# Patient Record
Sex: Female | Born: 1995 | Hispanic: Yes | Marital: Single | Smoking: Never smoker
Health system: Southern US, Community
[De-identification: ages and names within clinical notes are randomized; demographics above are authoritative.]

## PROBLEM LIST (undated history)

## (undated) ENCOUNTER — Inpatient Hospital Stay (HOSPITAL_COMMUNITY): Payer: Self-pay

## (undated) DIAGNOSIS — H209 Unspecified iridocyclitis: Secondary | ICD-10-CM

## (undated) DIAGNOSIS — K819 Cholecystitis, unspecified: Secondary | ICD-10-CM

## (undated) DIAGNOSIS — Z789 Other specified health status: Secondary | ICD-10-CM

## (undated) DIAGNOSIS — E079 Disorder of thyroid, unspecified: Secondary | ICD-10-CM

## (undated) DIAGNOSIS — H4040X0 Glaucoma secondary to eye inflammation, unspecified eye, stage unspecified: Secondary | ICD-10-CM

## (undated) HISTORY — PX: MOUTH SURGERY: SHX715

---
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
## 2016-06-09 ENCOUNTER — Encounter (HOSPITAL_COMMUNITY): Payer: Self-pay

## 2016-06-09 ENCOUNTER — Emergency Department (HOSPITAL_COMMUNITY)
Admission: EM | Admit: 2016-06-09 | Discharge: 2016-06-09 | Disposition: A | Discharge status: Home / Self Care | Payer: Medicaid Other | Attending: Emergency Medicine | Admitting: Emergency Medicine

## 2016-06-09 DIAGNOSIS — R5383 Other fatigue: Secondary | ICD-10-CM

## 2016-06-09 DIAGNOSIS — M545 Low back pain, unspecified: Secondary | ICD-10-CM

## 2016-06-09 DIAGNOSIS — N9489 Other specified conditions associated with female genital organs and menstrual cycle: Secondary | ICD-10-CM | POA: Insufficient documentation

## 2016-06-09 DIAGNOSIS — R109 Unspecified abdominal pain: Secondary | ICD-10-CM | POA: Diagnosis present

## 2016-06-09 DIAGNOSIS — R112 Nausea with vomiting, unspecified: Secondary | ICD-10-CM | POA: Diagnosis not present

## 2016-06-09 DIAGNOSIS — R110 Nausea: Secondary | ICD-10-CM

## 2016-06-09 LAB — COMPREHENSIVE METABOLIC PANEL
ALBUMIN: 5.1 g/dL — AB (ref 3.5–5.0)
ALK PHOS: 57 U/L (ref 38–126)
ALT: 16 U/L (ref 14–54)
ANION GAP: 7 (ref 5–15)
AST: 24 U/L (ref 15–41)
BUN: 6 mg/dL (ref 6–20)
CALCIUM: 10.1 mg/dL (ref 8.9–10.3)
CO2: 26 mmol/L (ref 22–32)
Chloride: 107 mmol/L (ref 101–111)
Creatinine, Ser: 0.59 mg/dL (ref 0.44–1.00)
GFR calc Af Amer: >60 mL/min (ref >60)
GFR calc non Af Amer: >60 mL/min (ref >60)
GLUCOSE: 85 mg/dL (ref 65–99)
Potassium: 3.9 mmol/L (ref 3.5–5.1)
SODIUM: 140 mmol/L (ref 135–145)
Total Bilirubin: 0.5 mg/dL (ref 0.3–1.2)
Total Protein: 8.8 g/dL — ABNORMAL HIGH (ref 6.5–8.1)

## 2016-06-09 LAB — CBC WITH DIFFERENTIAL/PLATELET
BASOS ABS: 0.1 10*3/uL (ref 0.0–0.1)
BASOS PCT: 1 %
EOS ABS: 0.1 10*3/uL (ref 0.0–0.7)
Eosinophils Relative: 2 %
HCT: 44.2 % (ref 36.0–46.0)
HEMOGLOBIN: 15.1 g/dL — AB (ref 12.0–15.0)
Lymphocytes Relative: 37 %
Lymphs Abs: 2.2 10*3/uL (ref 0.7–4.0)
MCH: 30.4 pg (ref 26.0–34.0)
MCHC: 34.2 g/dL (ref 30.0–36.0)
MCV: 88.9 fL (ref 78.0–100.0)
MONOS PCT: 5 %
Monocytes Absolute: 0.3 10*3/uL (ref 0.1–1.0)
NEUTROS ABS: 3.3 10*3/uL (ref 1.7–7.7)
NEUTROS PCT: 55 %
Platelets: 265 10*3/uL (ref 150–400)
RBC: 4.97 MIL/uL (ref 3.87–5.11)
RDW: 13 % (ref 11.5–15.5)
WBC: 6 10*3/uL (ref 4.0–10.5)

## 2016-06-09 LAB — URINE MICROSCOPIC-ADD ON: RBC / HPF: NONE SEEN RBC/hpf (ref 0–5)

## 2016-06-09 LAB — URINALYSIS, ROUTINE W REFLEX MICROSCOPIC
BILIRUBIN URINE: NEGATIVE
Glucose, UA: NEGATIVE mg/dL
Ketones, ur: NEGATIVE mg/dL
Leukocytes, UA: NEGATIVE
Nitrite: NEGATIVE
PROTEIN: NEGATIVE mg/dL
Specific Gravity, Urine: 1.005 (ref 1.005–1.030)
pH: 7.5 (ref 5.0–8.0)

## 2016-06-09 LAB — LIPASE, BLOOD: Lipase: 31 U/L (ref 11–51)

## 2016-06-09 LAB — HCG, QUANTITATIVE, PREGNANCY

## 2016-06-09 NOTE — ED Triage Notes (Signed)
Pt presents with 3-4 week h/o nausea, generalized abdominal pain and back pain.  Pt seen at clinic, was treated for UTI with negative pregnancy urine test.  Pt took abx without resolution.  Pt's period was 10/3 and only lasted 2 days.

## 2016-06-09 NOTE — ED Notes (Signed)
MD at bedside. 

## 2016-06-09 NOTE — ED Provider Notes (Signed)
[facility]-EMERGENCY DEPT Provider Note   CSN: [id number] Arrival date & time: 06/09/16  1149     History   Chief Complaint Chief Complaint  Patient presents with  . Abdominal Pain    HPI Lindsay Richardson is a 20 y.o. female.  HPI  20 year old female presents with nausea, back pain, and abdominal pain. These have been ongoing for 3 weeks. Symptoms come and go. Most prominently she has the nausea with occasional vomiting as well as the back pain. Back pain is lower thoracic/upper lumbar and a little right of midline. No trauma. No radiation of the pain. No weakness, numbness, or leg pain. No urinary symptoms. She also occasionally has abdominal pain. Over her last 3 menstrual cycles she has not missed any cycles but they have become less consistent than normal with less bleeding. She went to an OB/GYN this morning who told her there was nothing wrong. She has had multiple negative urine pregnancy test. Currently denies back pain or abdominal pain. Only mild nausea at this time. She has not noticed any weight loss but feels like she is bloated in her lower abdomen. She was treated for a UTI when she saw her PCP 3 weeks ago but has not felt any different. Has never had urinary symptoms. No fevers. Has felt diffusely fatigued. Has not noticed any neck swelling.  History reviewed. No pertinent past medical history.  There are no active problems to display for this patient.   Past Surgical History:  Procedure Laterality Date  . MOUTH SURGERY      OB History    No data available       Home Medications    Prior to Admission medications   Medication Sig Start Date End Date Taking? Authorizing Provider  hydrocortisone 2.5 % cream Apply topically 3 (three) times daily. 05/26/14   Lowanda Foster, NP  IRON PO Take 1 tablet by mouth 2 (two) times daily.     Historical Provider, MD  ondansetron (ZOFRAN-ODT) 4 MG disintegrating tablet Take 1 tablet (4 mg total) by mouth every 8 (eight)  hours as needed for nausea or vomiting. 08/06/13   Marcellina Millin, MD    Family History History reviewed. No pertinent family history.  Social History Social History  Substance Use Topics  . Smoking status: Never Smoker  . Smokeless tobacco: Never Used  . Alcohol use No     Allergies   Review of patient's allergies indicates no known allergies.   Review of Systems Review of Systems  Constitutional: Positive for fatigue. Negative for fever and unexpected weight change.  Gastrointestinal: Positive for abdominal distention, abdominal pain, nausea and vomiting. Negative for constipation and diarrhea.  Genitourinary: Positive for menstrual problem and vaginal bleeding. Negative for dysuria and vaginal discharge.  Musculoskeletal: Positive for back pain.  Neurological: Negative for weakness and numbness.  All other systems reviewed and are negative.    Physical Exam Updated Vital Signs BP 101/77 (BP Location: Right Arm) Comment: Simultaneous filing. User may not have seen previous data.  Pulse 78 Comment: Simultaneous filing. User may not have seen previous data.  Temp 98.1 F (36.7 C) (Oral)   Resp 16   LMP 06/06/2016 (Exact Date)   SpO2 99% Comment: Simultaneous filing. User may not have seen previous data.  Physical Exam  Constitutional: She is oriented to person, place, and time. She appears well-developed and well-nourished.  HENT:  Head: Normocephalic and atraumatic.  Right Ear: External ear normal.  Left Ear: External ear normal.  Nose:  Nose normal.  Eyes: Right eye exhibits no discharge. Left eye exhibits no discharge.  Neck: Neck supple. No thyromegaly present.  Cardiovascular: Normal rate, regular rhythm and normal heart sounds.   Pulmonary/Chest: Effort normal and breath sounds normal.  Abdominal: Soft. She exhibits no distension. There is no tenderness. There is no CVA tenderness.  Musculoskeletal:       Thoracic back: She exhibits no tenderness.       Lumbar  back: She exhibits no tenderness.  Neurological: She is alert and oriented to person, place, and time.  5/5 strength in BLE. Normal gross sensation  Skin: Skin is warm and dry.  Nursing note and vitals reviewed.    ED Treatments / Results  Labs (all labs ordered are listed, but only abnormal results are displayed) Labs Reviewed  URINALYSIS, ROUTINE W REFLEX MICROSCOPIC (NOT AT [facility]) - Abnormal; Notable for the following:       Result Value   Hgb urine dipstick SMALL (*)    All other components within normal limits  CBC WITH DIFFERENTIAL/PLATELET - Abnormal; Notable for the following:    Hemoglobin 15.1 (*)    All other components within normal limits  COMPREHENSIVE METABOLIC PANEL - Abnormal; Notable for the following:    Total Protein 8.8 (*)    Albumin 5.1 (*)    All other components within normal limits  URINE MICROSCOPIC-ADD ON - Abnormal; Notable for the following:    Squamous Epithelial / LPF 0-5 (*)    Bacteria, UA RARE (*)    All other components within normal limits  HCG, QUANTITATIVE, PREGNANCY  LIPASE, BLOOD    EKG  EKG Interpretation None       Radiology No results found.  Procedures Procedures (including critical care time)  Medications Ordered in ED Medications - No data to display   Initial Impression / Assessment and Plan / ED Course  I have reviewed the triage vital signs and the nursing notes.  Pertinent labs & imaging results that were available during my care of the patient were reviewed by me and considered in my medical decision making (see chart for details).  Clinical Course  Comment By Time  Patient's exam is benign including no abdominal tenderness or focal neurologic findings. There is actually no back tenderness either. She declines nausea medicine, her nausea is only very mild at this time. Will add on TSH given vague symptoms with fatigue and weight gain with menstrual changes Pricilla Loveless, MD 10/05 1312  After further discussion  with patient, she does not want to do the TSH given that it could take at least one and possibly 2 or more hours. She also wants to cancel the x-ray because she was to go home and follow-up with her outpatient family doctor. I was very reasonable, and discussed return precautions. Her outpatient physician should be able to workup her fatigue. Workup here is unremarkable including negative pregnancy test and negative lab work and urine. Pricilla Loveless, MD 10/05 1324    Patient declines zofran rx. I think outpatient workup is appropriate and have low suspicion for acute emergency at this time. No indication for CT scan of abd which patient's PCP has stated may be necessary. No abd pain now.   Final Clinical Impressions(s) / ED Diagnoses   Final diagnoses:  Other fatigue  Acute low back pain without sciatica, unspecified back pain laterality  Nausea in adult    New Prescriptions Discharge Medication List as of 06/09/2016  1:28 PM  Pricilla LovelessScott Corneshia Hines, MD 06/09/16 [phone]

## 2016-06-23 DIAGNOSIS — R102 Pelvic and perineal pain unspecified side: Secondary | ICD-10-CM | POA: Insufficient documentation

## 2016-08-11 DIAGNOSIS — K439 Ventral hernia without obstruction or gangrene: Secondary | ICD-10-CM | POA: Insufficient documentation

## 2017-12-21 ENCOUNTER — Ambulatory Visit (INDEPENDENT_AMBULATORY_CARE_PROVIDER_SITE_OTHER): Payer: Self-pay

## 2017-12-21 DIAGNOSIS — Z3200 Encounter for pregnancy test, result unknown: Secondary | ICD-10-CM

## 2017-12-21 DIAGNOSIS — Z3201 Encounter for pregnancy test, result positive: Secondary | ICD-10-CM

## 2017-12-21 LAB — POCT URINE PREGNANCY: PREG TEST UR: POSITIVE — AB

## 2017-12-21 NOTE — Progress Notes (Signed)
..   [name] presents today for UPT. She has no unusual complaints. LMP:11-19-17    OBJECTIVE: Appears well, in no apparent distress.  OB History   None    Home UPT Result:Positive In-Office UPT result:Positive I have reviewed the patient's medical, obstetrical, social, and family histories, and medications.   ASSESSMENT: Positive pregnancy test  PLAN Prenatal care to be completed at: [facility]

## 2017-12-21 NOTE — Progress Notes (Signed)
I have reviewed this chart and agree with the RN/CMA assessment and management.    K. Meryl Joell Usman, M.D. Attending Obstetrician & Gynecologist, [facility],  Medical Group  

## 2018-01-12 ENCOUNTER — Encounter (HOSPITAL_COMMUNITY): Payer: Self-pay | Admitting: *Deleted

## 2018-01-12 ENCOUNTER — Inpatient Hospital Stay (HOSPITAL_COMMUNITY): Payer: Medicaid Other

## 2018-01-12 ENCOUNTER — Inpatient Hospital Stay (HOSPITAL_COMMUNITY)
Admission: AD | Admit: 2018-01-12 | Discharge: 2018-01-12 | Disposition: A | Discharge status: Home / Self Care | Payer: Medicaid Other | Source: Ambulatory Visit | Attending: Obstetrics and Gynecology | Admitting: Obstetrics and Gynecology

## 2018-01-12 DIAGNOSIS — N760 Acute vaginitis: Secondary | ICD-10-CM | POA: Diagnosis not present

## 2018-01-12 DIAGNOSIS — O209 Hemorrhage in early pregnancy, unspecified: Secondary | ICD-10-CM | POA: Insufficient documentation

## 2018-01-12 DIAGNOSIS — Z3490 Encounter for supervision of normal pregnancy, unspecified, unspecified trimester: Secondary | ICD-10-CM

## 2018-01-12 DIAGNOSIS — O26891 Other specified pregnancy related conditions, first trimester: Secondary | ICD-10-CM | POA: Diagnosis not present

## 2018-01-12 DIAGNOSIS — O2331 Infections of other parts of urinary tract in pregnancy, first trimester: Secondary | ICD-10-CM | POA: Insufficient documentation

## 2018-01-12 DIAGNOSIS — N939 Abnormal uterine and vaginal bleeding, unspecified: Secondary | ICD-10-CM | POA: Diagnosis present

## 2018-01-12 DIAGNOSIS — R109 Unspecified abdominal pain: Secondary | ICD-10-CM

## 2018-01-12 DIAGNOSIS — B9689 Other specified bacterial agents as the cause of diseases classified elsewhere: Secondary | ICD-10-CM | POA: Insufficient documentation

## 2018-01-12 DIAGNOSIS — Z3A08 8 weeks gestation of pregnancy: Secondary | ICD-10-CM | POA: Insufficient documentation

## 2018-01-12 LAB — URINALYSIS, ROUTINE W REFLEX MICROSCOPIC
Bilirubin Urine: NEGATIVE
GLUCOSE, UA: NEGATIVE mg/dL
Ketones, ur: NEGATIVE mg/dL
Leukocytes, UA: NEGATIVE
Nitrite: NEGATIVE
PH: 6 (ref 5.0–8.0)
Protein, ur: NEGATIVE mg/dL
Specific Gravity, Urine: 1.01 (ref 1.005–1.030)

## 2018-01-12 LAB — CBC
HCT: 37.9 % (ref 36.0–46.0)
Hemoglobin: 12.9 g/dL (ref 12.0–15.0)
MCH: 31 pg (ref 26.0–34.0)
MCHC: 34 g/dL (ref 30.0–36.0)
MCV: 91.1 fL (ref 78.0–100.0)
PLATELETS: 247 10*3/uL (ref 150–400)
RBC: 4.16 MIL/uL (ref 3.87–5.11)
RDW: 12.3 % (ref 11.5–15.5)
WBC: 9 10*3/uL (ref 4.0–10.5)

## 2018-01-12 LAB — WET PREP, GENITAL
Sperm: NONE SEEN
Trich, Wet Prep: NONE SEEN
Yeast Wet Prep HPF POC: NONE SEEN

## 2018-01-12 LAB — HCG, QUANTITATIVE, PREGNANCY: hCG, Beta Chain, Quant, S: 3926 m[IU]/mL — ABNORMAL HIGH (ref <5)

## 2018-01-12 MED ORDER — METRONIDAZOLE 500 MG PO TABS: 500.0000 mg | ORAL_TABLET | Freq: Two times a day (BID) | ORAL | 0 refills | Status: AC

## 2018-01-12 NOTE — MAU Provider Note (Signed)
Chief Complaint: Vaginal Bleeding   SUBJECTIVE HPI: Amerie Beaumont is a 22 y.o. G1P0 at [redacted]w[redacted]d who presents to MAU with complaints of vaginal spotting.  Patient states the last 3 days she has had spotting.  Spotting is pink in nature.  Has associated lower abdominal cramps and small blood clots.  Had to wear a panty liner.  States that the cramps and spotting coming ago.  She has confirmed pregnancy.  Patient denies any recent intercourse.  Denies vaginal discharge, vaginal odor, fevers, nausea, vomiting, dysuria.   History reviewed. No pertinent past medical history. OB History  Gravida Para Term Preterm AB Living  1            SAB TAB Ectopic Multiple Live Births               # Outcome Date GA Lbr Len/2nd Weight Sex Delivery Anes PTL Lv  1 Current            Past Surgical History:  Procedure Laterality Date  . MOUTH SURGERY     Social History   Socioeconomic History  . Marital status: Single    Spouse name: Not on file  . Number of children: Not on file  . Years of education: Not on file  . Highest education level: Not on file  Occupational History  . Not on file  Social Needs  . Financial resource strain: Not on file  . Food insecurity:    Worry: Not on file    Inability: Not on file  . Transportation needs:    Medical: Not on file    Non-medical: Not on file  Tobacco Use  . Smoking status: Never Smoker  . Smokeless tobacco: Never Used  Substance and Sexual Activity  . Alcohol use: No  . Drug use: Never  . Sexual activity: Yes  Lifestyle  . Physical activity:    Days per week: Not on file    Minutes per session: Not on file  . Stress: Not on file  Relationships  . Social connections:    Talks on phone: Not on file    Gets together: Not on file    Attends religious service: Not on file    Active member of club or organization: Not on file    Attends meetings of clubs or organizations: Not on file    Relationship status: Not on file  . Intimate  partner violence:    Fear of current or ex partner: Not on file    Emotionally abused: Not on file    Physically abused: Not on file    Forced sexual activity: Not on file  Other Topics Concern  . Not on file  Social History Narrative  . Not on file   No current facility-administered medications on file prior to encounter.    Current Outpatient Medications on File Prior to Encounter  Medication Sig Dispense Refill  . hydrocortisone 2.5 % cream Apply topically 3 (three) times daily. 30 g 0  . IRON PO Take 1 tablet by mouth 2 (two) times daily.     . ondansetron (ZOFRAN-ODT) 4 MG disintegrating tablet Take 1 tablet (4 mg total) by mouth every 8 (eight) hours as needed for nausea or vomiting. 20 tablet 0   No Known Allergies  I have reviewed the past Medical Hx, Surgical Hx, Social Hx, Allergies and Medications.   REVIEW OF SYSTEMS All systems reviewed and are negative for acute change except as noted in the HPI.   OBJECTIVE  BP (!) 101/58   Pulse 80   Temp 98.1 F (36.7 C) (Oral)   Resp 18   Ht  (1.499 m)   Wt 58.5 kg (129 lb)   LMP 11/19/2017   BMI 26.05 kg/m    PHYSICAL EXAM Constitutional: Well-developed, well-nourished female in no acute distress.  Cardiovascular: normal rate and rhythm, pulses intact Respiratory: normal rate and effort.  GI: Abd soft, LLQ tenderness, non-distended. MS: Extremities nontender, no edema, normal ROM Neurologic: Alert and oriented x 4. No focal deficits SPECULUM EXAM: NEFG, physiologic discharge, mucous tinged blood noted; no active bleeding, cervix clean BIMANUAL: cervix closed and long; uterus normal size, no adnexal tenderness or masses. No CMT. Psych: normal mood and affect  LAB RESULTS Results for orders placed or performed during the hospital encounter of 01/12/18 (from the past 24 hour(s))  Urinalysis, Routine w reflex microscopic     Status: Abnormal   Collection Time: 01/12/18  8:00 PM  Result Value Ref Range    Color, Urine YELLOW YELLOW   APPearance CLEAR CLEAR   Specific Gravity, Urine 1.010 1.005 - 1.030   pH 6.0 5.0 - 8.0   Glucose, UA NEGATIVE NEGATIVE mg/dL   Hgb urine dipstick LARGE (A) NEGATIVE   Bilirubin Urine NEGATIVE NEGATIVE   Ketones, ur NEGATIVE NEGATIVE mg/dL   Protein, ur NEGATIVE NEGATIVE mg/dL   Nitrite NEGATIVE NEGATIVE   Leukocytes, UA NEGATIVE NEGATIVE   RBC / HPF 0-5 0 - 5 RBC/hpf   WBC, UA 0-5 0 - 5 WBC/hpf   Bacteria, UA RARE (A) NONE SEEN   Squamous Epithelial / LPF 0-5 0 - 5   Mucus PRESENT   Wet prep, genital     Status: Abnormal   Collection Time: 01/12/18  9:05 PM  Result Value Ref Range   Yeast Wet Prep HPF POC NONE SEEN NONE SEEN   Trich, Wet Prep NONE SEEN NONE SEEN   Clue Cells Wet Prep HPF POC PRESENT (A) NONE SEEN   WBC, Wet Prep HPF POC FEW (A) NONE SEEN   Sperm NONE SEEN   hCG, quantitative, pregnancy     Status: Abnormal   Collection Time: 01/12/18  9:33 PM  Result Value Ref Range   hCG, Beta Chain, Quant, S 3,926 (H) <5 mIU/mL  CBC     Status: None   Collection Time: 01/12/18  9:33 PM  Result Value Ref Range   WBC 9.0 4.0 - 10.5 K/uL   RBC 4.16 3.87 - 5.11 MIL/uL   Hemoglobin 12.9 12.0 - 15.0 g/dL   HCT 16.1 09.6 - 04.5 %   MCV 91.1 78.0 - 100.0 fL   MCH 31.0 26.0 - 34.0 pg   MCHC 34.0 30.0 - 36.0 g/dL   RDW 40.9 81.1 - 91.4 %   Platelets 247 150 - 400 K/uL  ABO/Rh     Status: None (Preliminary result)   Collection Time: 01/12/18  9:33 PM  Result Value Ref Range   ABO/RH(D)      O POS Performed at [facility], [address], [place], [place] [address]     IMAGING US Ob Comp Less 14 Wks  Result Date: 01/12/2018 CLINICAL DATA:  22 y/o F; 2-3 days of spotting, progressively heavier. EXAM: OBSTETRIC <14 WK [place] AND TRANSVAGINAL OB US TECHNIQUE: Both transabdominal and transvaginal ultrasound examinations were performed for complete evaluation of the gestation as well as the maternal uterus, adnexal regions, and pelvic cul-de-sac.  Transvaginal technique was performed to assess early pregnancy.  COMPARISON:  None. FINDINGS: Intrauterine gestational sac: Single Yolk sac:  Visualized. Fetal pole: Not visualized. MSD: 15.1 mm   6 w   2 d Subchorionic hemorrhage:  None visualized. Maternal uterus/adnexae: Right ovary measures 3.2 x 1.9 x 2.2 cm. Left ovary measures 2.7 x 1.8 x 2.0 cm. No adnexal lesion identified. IMPRESSION: Probable early intrauterine gestational sac and yolk sac, but no fetal pole, or cardiac activity yet visualized. Recommend follow-up quantitative B-HCG levels and follow-up US in 14 days to assess viability. This recommendation follows SRU consensus guidelines: Diagnostic Criteria for Nonviable Pregnancy Early in the First Trimester. Malva Limes Med 2013; 829:5621-30. Electronically Signed   By: Mitzi Hansen M.D.   On: 01/12/2018 22:36   US Ob Transvaginal  Result Date: 01/12/2018 CLINICAL DATA:  22 y/o F; 2-3 days of spotting, progressively heavier. EXAM: OBSTETRIC <14 WK [place] AND TRANSVAGINAL OB US TECHNIQUE: Both transabdominal and transvaginal ultrasound examinations were performed for complete evaluation of the gestation as well as the maternal uterus, adnexal regions, and pelvic cul-de-sac. Transvaginal technique was performed to assess early pregnancy. COMPARISON:  None. FINDINGS: Intrauterine gestational sac: Single Yolk sac:  Visualized. Fetal pole: Not visualized. MSD: 15.1 mm   6 w   2 d Subchorionic hemorrhage:  None visualized. Maternal uterus/adnexae: Right ovary measures 3.2 x 1.9 x 2.2 cm. Left ovary measures 2.7 x 1.8 x 2.0 cm. No adnexal lesion identified. IMPRESSION: Probable early intrauterine gestational sac and yolk sac, but no fetal pole, or cardiac activity yet visualized. Recommend follow-up quantitative B-HCG levels and follow-up US in 14 days to assess viability. This recommendation follows SRU consensus guidelines: Diagnostic Criteria for Nonviable Pregnancy Early in the First Trimester.  Malva Limes Med 2013; 865:7846-96. Electronically Signed   By: Mitzi Hansen M.D.   On: 01/12/2018 22:36    MAU Management/MDM: Vitals and nursing notes reviewed Orders Placed This Encounter  Procedures  . Wet prep, genital  . US OB Comp Less 14 Wks  . US OB Transvaginal  . Urinalysis, Routine w reflex microscopic  . hCG, quantitative, pregnancy  . CBC  . ABO/Rh  . Discharge patient Discharge disposition: 01-Home or Self Care; Discharge patient date: 01/12/2018    Meds ordered this encounter  Medications  . metroNIDAZOLE (FLAGYL) 500 MG tablet    Sig: Take 1 tablet (500 mg total) by mouth 2 (two) times daily for 7 days.    Dispense:  14 tablet    Refill:  0    Early pregnancy. Probable IUP. Will need to trend hcg levels. Paitent with BV on wet prep. VSS. Other blood work unremarkable.  Plan of care reviewed with patient, including labs and tests ordered and medical treatment.   ASSESSMENT 1. Early stage of pregnancy   2. Vaginal bleeding in pregnancy, first trimester   3. Abdominal pain during pregnancy in first trimester   4. Bacterial vaginitis     PLAN Discharge home in stable condition. Rx for Flagyl Pt discharged with strict return/early pregnancy precautions Follow-up in clinic for repeat hcg level Handout given  Follow-up Information    Center for [facility]. Go on 01/15/2018.   Specialty:  Obstetrics and Gynecology Why:  Lab appointment for repeat blood work at 1:30p Contact information: [address] [place] [address] [phone]          Allergies as of 01/12/2018   No Known Allergies     Medication List    TAKE these medications  hydrocortisone 2.5 % cream Apply topically 3 (three) times daily.   IRON PO Take 1 tablet by mouth 2 (two) times daily.   metroNIDAZOLE 500 MG tablet Commonly known as:  FLAGYL Take 1 tablet (500 mg total) by mouth 2 (two) times daily for 7 days.   ondansetron 4 MG  disintegrating tablet Commonly known as:  ZOFRAN-ODT Take 1 tablet (4 mg total) by mouth every 8 (eight) hours as needed for nausea or vomiting.        Caryl Ada, DO OB Fellow Center for [facility], [facility] 01/12/2018, 11:52 PM

## 2018-01-12 NOTE — MAU Note (Signed)
Spotting for 2-3 days. Today alittle heavier today with small clots. Bright red bleeding today with mild cramping. Headache

## 2018-01-12 NOTE — Discharge Instructions (Signed)

## 2018-01-13 ENCOUNTER — Encounter (HOSPITAL_COMMUNITY): Payer: Self-pay

## 2018-01-13 ENCOUNTER — Inpatient Hospital Stay (HOSPITAL_COMMUNITY): Payer: Medicaid Other

## 2018-01-13 ENCOUNTER — Inpatient Hospital Stay (HOSPITAL_COMMUNITY)
Admission: AD | Admit: 2018-01-13 | Discharge: 2018-01-13 | Disposition: A | Discharge status: Home / Self Care | Payer: Medicaid Other | Source: Ambulatory Visit | Attending: Family Medicine | Admitting: Family Medicine

## 2018-01-13 DIAGNOSIS — Z79899 Other long term (current) drug therapy: Secondary | ICD-10-CM | POA: Insufficient documentation

## 2018-01-13 DIAGNOSIS — O034 Incomplete spontaneous abortion without complication: Secondary | ICD-10-CM | POA: Insufficient documentation

## 2018-01-13 DIAGNOSIS — R109 Unspecified abdominal pain: Secondary | ICD-10-CM | POA: Insufficient documentation

## 2018-01-13 DIAGNOSIS — O209 Hemorrhage in early pregnancy, unspecified: Secondary | ICD-10-CM

## 2018-01-13 DIAGNOSIS — O26891 Other specified pregnancy related conditions, first trimester: Secondary | ICD-10-CM

## 2018-01-13 DIAGNOSIS — M545 Low back pain: Secondary | ICD-10-CM | POA: Diagnosis not present

## 2018-01-13 LAB — CBC
HCT: 38.4 % (ref 36.0–46.0)
Hemoglobin: 13.6 g/dL (ref 12.0–15.0)
MCH: 31.1 pg (ref 26.0–34.0)
MCHC: 35.4 g/dL (ref 30.0–36.0)
MCV: 87.9 fL (ref 78.0–100.0)
PLATELETS: 254 10*3/uL (ref 150–400)
RBC: 4.37 MIL/uL (ref 3.87–5.11)
RDW: 12.1 % (ref 11.5–15.5)
WBC: 9.6 10*3/uL (ref 4.0–10.5)

## 2018-01-13 LAB — HCG, QUANTITATIVE, PREGNANCY: hCG, Beta Chain, Quant, S: 3457 m[IU]/mL — ABNORMAL HIGH (ref <5)

## 2018-01-13 LAB — ABO/RH: ABO/RH(D): O POS

## 2018-01-13 MED ORDER — OXYCODONE-ACETAMINOPHEN 5-325 MG PO TABS: 1.0000 | ORAL_TABLET | ORAL | 0 refills | Status: DC | PRN

## 2018-01-13 MED ORDER — HYDROMORPHONE HCL 1 MG/ML IJ SOLN
1.0000 mg | Freq: Once | INTRAMUSCULAR | Status: AC
  Administered 2018-01-13: 1 mg via INTRAMUSCULAR
  Filled 2018-01-13: qty 1

## 2018-01-13 MED ORDER — IBUPROFEN 600 MG PO TABS: 600.0000 mg | ORAL_TABLET | Freq: Four times a day (QID) | ORAL | 1 refills | Status: DC | PRN

## 2018-01-13 MED ORDER — MISOPROSTOL 200 MCG PO TABS: 200.0000 ug | ORAL_TABLET | Freq: Once | ORAL | 0 refills | Status: DC

## 2018-01-13 NOTE — Progress Notes (Addendum)
G1 @ 7.[redacted] wksga. Here due increasing abd lower pain that started on left side and moved over to the center. Rate pain 10/10 "it hurts like s...". Bleeding increased from yesterday.   Was seen last night for the same issue. U/s, pelvic exam, and blood test done.   vomited today at 1700.   1830: Provider at bs assessing. Pelvic exam done.   Lab paged.   1840: Medicated per order.   1848: lab at bs   1853: U/S paged  Pt to U/S via wheelchair   2050: d/c instructions given with pt understanding. Px for pain med given along with letter of excuse.

## 2018-01-13 NOTE — Discharge Instructions (Signed)

## 2018-01-13 NOTE — MAU Provider Note (Addendum)
History     CSN: [id number]  Arrival date and time: 01/13/18 1740   First Provider Initiated Contact with Patient 01/13/18 1823      Chief Complaint  Patient presents with  . Vaginal Bleeding  . Abdominal Pain  . Back Pain   HPI Lindsay Richardson is a 22 y.o. G1P0 at [redacted]w[redacted]d by LMP who presents with abdominal pain, back pain, and vaginal bleeding. Was seen in MAU last night with complaint of abdominal cramping and vaginal bleeding. Had ultrasound that showed IUGS w/YS. States symptoms significantly worsened this morning with addition of back pain. States she saturated 2 panty liners in 1 hour and was passing small clots as well. Reports low back pain and intermittent lower abdominal cramping that she rates 8/10. Has not treated symptoms today. Nothing makes better or worse. Denies fever/chills.   OB History    Gravida  1   Para      Term      Preterm      AB      Living        SAB      TAB      Ectopic      Multiple      Live Births              No past medical history on file.  Past Surgical History:  Procedure Laterality Date  . MOUTH SURGERY      No family history on file.  Social History   Tobacco Use  . Smoking status: Never Smoker  . Smokeless tobacco: Never Used  Substance Use Topics  . Alcohol use: No  . Drug use: Never    Allergies: No Known Allergies  Medications Prior to Admission  Medication Sig Dispense Refill Last Dose  . hydrocortisone 2.5 % cream Apply topically 3 (three) times daily. 30 g 0   . IRON PO Take 1 tablet by mouth 2 (two) times daily.    08/05/2013 at Unknown time  . metroNIDAZOLE (FLAGYL) 500 MG tablet Take 1 tablet (500 mg total) by mouth 2 (two) times daily for 7 days. 14 tablet 0   . ondansetron (ZOFRAN-ODT) 4 MG disintegrating tablet Take 1 tablet (4 mg total) by mouth every 8 (eight) hours as needed for nausea or vomiting. 20 tablet 0     Review of Systems  Constitutional: Negative.   Gastrointestinal:  Positive for abdominal pain and vomiting (once this morning, no nausea). Negative for nausea.  Genitourinary: Positive for vaginal bleeding.  Musculoskeletal: Positive for back pain.   Physical Exam   Blood pressure (!) 101/58, pulse 87, temperature 97.9 F (36.6 C), temperature source Oral, resp. rate 18, weight 130 lb (59 kg), last menstrual period 11/19/2017, SpO2 100 %.  Physical Exam  Nursing note and vitals reviewed. Constitutional: She is oriented to person, place, and time. She appears well-developed and well-nourished. No distress.  HENT:  Head: Normocephalic and atraumatic.  Eyes: Conjunctivae are normal. Right eye exhibits no discharge. Left eye exhibits no discharge. No scleral icterus.  Neck: Normal range of motion.  Respiratory: Effort normal. No respiratory distress.  GI: Soft. There is no tenderness.  Genitourinary: There is bleeding in the vagina.  Genitourinary Comments: Small amount of dark red blood. Cervix closed  Neurological: She is alert and oriented to person, place, and time.  Skin: Skin is warm and dry. She is not diaphoretic.  Psychiatric: She has a normal mood and affect. Her behavior is normal. Judgment and thought  content normal.    MAU Course  Procedures Results for orders placed or performed during the hospital encounter of 01/13/18 (from the past 24 hour(s))  CBC     Status: None   Collection Time: 01/13/18  6:49 PM  Result Value Ref Range   WBC 9.6 4.0 - 10.5 K/uL   RBC 4.37 3.87 - 5.11 MIL/uL   Hemoglobin 13.6 12.0 - 15.0 g/dL   HCT 16.1 09.6 - 04.5 %   MCV 87.9 78.0 - 100.0 fL   MCH 31.1 26.0 - 34.0 pg   MCHC 35.4 30.0 - 36.0 g/dL   RDW 40.9 81.1 - 91.4 %   Platelets 254 150 - 400 K/uL  hCG, quantitative, pregnancy     Status: Abnormal   Collection Time: 01/13/18  6:49 PM  Result Value Ref Range   hCG, Beta Chain, Quant, S 3,457 (H) <5 mIU/mL      Small amount of dark red blood on exam; cervix closed. Pt rocking back and forth in  pain. Will repeat labs & ultrasound.  Dilaudid 1 mg IM for pain.   Care turned over to Caren Griffins, Garen Lah, NP 01/13/2018 7:19 PM   Quantitative beta hCG 3926 at 2100 on 01/11/18 [place] prelim: ?YS, irreg shaped IUGS in LUS, MSD c/w [redacted]w[redacted]d,no [facility] or other abnormality  MDM: Explained clinical findings of moderately heavy bleeding, severe cramping along with [place] results and falling quantitative beta hCG both C/W SAB in progress.  Discussed options with patient and partner of cytotec pv or buccal or expectant management> will proceed with buccal cytotec. Return for uncontrolled severe pain, bleeding excessively or orthostatic symptoms  Assessment and Plan   1. Vaginal bleeding in pregnancy, first trimester   2. Abdominal pain during pregnancy in first trimester   3. Incomplete miscarriage    G1 with IUP, MSD c/w [redacted]w[redacted]d, hemodynamically stable Allergies as of 01/13/2018   No Known Allergies     Medication List    STOP taking these medications   hydrocortisone 2.5 % cream     TAKE these medications   ibuprofen 600 MG tablet Commonly known as:  ADVIL,MOTRIN Take 1 tablet (600 mg total) by mouth every 6 (six) hours as needed.   IRON PO Take 1 tablet by mouth 2 (two) times daily.   metroNIDAZOLE 500 MG tablet Commonly known as:  FLAGYL Take 1 tablet (500 mg total) by mouth 2 (two) times daily for 7 days.   misoprostol 200 MCG tablet Commonly known as:  CYTOTEC Take 1 tablet (200 mcg total) by mouth once for 1 dose. Place both tablets under tongue to dissolve and swallow   ondansetron 4 MG disintegrating tablet Commonly known as:  ZOFRAN-ODT Take 1 tablet (4 mg total) by mouth every 8 (eight) hours as needed for nausea or vomiting.   oxyCODONE-acetaminophen 5-325 MG tablet Commonly known as:  PERCOCET/ROXICET Take 1-2 tablets by mouth every 4 (four) hours as needed for severe pain.      Follow-up Information    Center for [facility] Follow up in 2  week(s).   Specialty:  Obstetrics and Gynecology Why:  Someone from the clinic will call you for appointment scheduling Contact information: [address] [place] [address] [phone]

## 2018-01-13 NOTE — MAU Note (Signed)
Patient c/o  +lower back pain Rating pain 8/10 Has not taken anything for the pain  +nausea/vomiting Emesis x2 today  +vaginal bleeding  Heavier than yesterday Dark in color; +quarter sized clots  Was seen yesterday in MAU

## 2018-01-15 ENCOUNTER — Telehealth: Payer: Self-pay | Admitting: *Deleted

## 2018-01-15 ENCOUNTER — Encounter: Payer: Self-pay | Admitting: *Deleted

## 2018-01-15 ENCOUNTER — Ambulatory Visit: Payer: Self-pay

## 2018-01-15 LAB — GC/CHLAMYDIA PROBE AMP (~~LOC~~) NOT AT ARMC
CHLAMYDIA, DNA PROBE: NEGATIVE
NEISSERIA GONORRHEA: NEGATIVE

## 2018-01-15 NOTE — Telephone Encounter (Signed)
Patient [facility] stat bhcg appointment. I called and left a message notifying her she missed her lab appointment and it is important to call back and reschedule today or tomorrow. Will also send letter.

## 2018-01-30 ENCOUNTER — Encounter: Payer: Self-pay | Admitting: Obstetrics and Gynecology

## 2018-01-30 ENCOUNTER — Ambulatory Visit: Payer: Medicaid Other | Admitting: Obstetrics and Gynecology

## 2018-01-30 VITALS — BP 119/74 | HR 69 | Wt 134.1 lb

## 2018-01-30 DIAGNOSIS — Z5189 Encounter for other specified aftercare: Secondary | ICD-10-CM | POA: Diagnosis not present

## 2018-01-30 DIAGNOSIS — O039 Complete or unspecified spontaneous abortion without complication: Secondary | ICD-10-CM | POA: Diagnosis not present

## 2018-01-31 ENCOUNTER — Encounter: Payer: Self-pay | Admitting: Obstetrics and Gynecology

## 2018-01-31 ENCOUNTER — Encounter: Payer: Self-pay | Admitting: Certified Nurse Midwife

## 2018-01-31 NOTE — Progress Notes (Signed)
GYNECOLOGY OFFICE VISIT NOTE  History:  22 y.o. G1P0 here today for for follow-up after miscarriage. Patient had incomplete Ab diagnosed on 01/13/18. She was given Cytotec to complete process. Patient states she had heavy bleeding and passed tissue. She bleed for like a week. Denies any current bleeding. No fevers. No discharge. No abdominal pain.  No other concerns. Not currently on birth control. Unsure if she wants to take anything at  This time.   No past medical history on file.  Past Surgical History:  Procedure Laterality Date  . MOUTH SURGERY      The following portions of the patient's history were reviewed and updated as appropriate: allergies, current medications, past family history, past medical history, past social history, past surgical history and problem list.    Review of Systems:  Pertinent items noted in HPI and remainder of comprehensive ROS otherwise negative.   Objective:  Physical Exam BP 119/74   Pulse 69   Wt 134 lb 1.6 oz (60.8 kg)   LMP 11/19/2017   Breastfeeding? Unknown   BMI 27.08 kg/m  CONSTITUTIONAL: Well-developed, well-nourished female in no acute distress.  SKIN: Skin is warm and dry.  PSYCHIATRIC: Normal mood and affect.  CARDIOVASCULAR: Normal heart rate noted RESPIRATORY: Effort and breath sounds normal, no problems with respiration noted ABDOMEN: Soft, no distention noted.  Non-tender to palpation. PELVIC: Normal appearing external genitalia; normal appearing vaginal mucosa and cervix.  No abnormal discharge noted.  Normal uterine size, no other palpable masses, no uterine or adnexal tenderness. MUSCULOSKELETAL: Normal range of motion. No edema noted.  Labs and Imaging US Ob Comp Less 14 Wks  Result Date: 01/12/2018 CLINICAL DATA:  22 y/o F; 2-3 days of spotting, progressively heavier. EXAM: OBSTETRIC <14 WK [place] AND TRANSVAGINAL OB US TECHNIQUE: Both transabdominal and transvaginal ultrasound examinations were performed for complete  evaluation of the gestation as well as the maternal uterus, adnexal regions, and pelvic cul-de-sac. Transvaginal technique was performed to assess early pregnancy. COMPARISON:  None. FINDINGS: Intrauterine gestational sac: Single Yolk sac:  Visualized. Fetal pole: Not visualized. MSD: 15.1 mm   6 w   2 d Subchorionic hemorrhage:  None visualized. Maternal uterus/adnexae: Right ovary measures 3.2 x 1.9 x 2.2 cm. Left ovary measures 2.7 x 1.8 x 2.0 cm. No adnexal lesion identified. IMPRESSION: Probable early intrauterine gestational sac and yolk sac, but no fetal pole, or cardiac activity yet visualized. Recommend follow-up quantitative B-HCG levels and follow-up US in 14 days to assess viability. This recommendation follows SRU consensus guidelines: Diagnostic Criteria for Nonviable Pregnancy Early in the First Trimester. Malva Limes Med 2013; 161:0960-45. Electronically Signed   By: Mitzi Hansen M.D.   On: 01/12/2018 22:36   US Ob Transvaginal  Result Date: 01/13/2018 CLINICAL DATA:  22 year old pregnant female with increasing vaginal bleeding and pelvic pain. EXAM: TRANSVAGINAL OB ULTRASOUND TECHNIQUE: Transvaginal ultrasound was performed for complete evaluation of the gestation as well as the maternal uterus, adnexal regions, and pelvic cul-de-sac. COMPARISON:  01/12/2018 ultrasound FINDINGS: Intrauterine gestational sac: Slightly irregular and now within the mid-lower uterine segment. Yolk sac:  Equivocal Embryo:  Not Visualized. MSD: 14.4 mm   6 w   2  d Subchorionic hemorrhage:  None visualized. Maternal uterus/adnexae: The ovaries bilaterally are unremarkable. No free fluid or adnexal mass. IMPRESSION: Intrauterine gestational sac now appears slightly irregular and lies within the mid-lower uterine segment. Equivocal yolk sac. These findings are worrisome for poor prognosis. Continued clinical/ultrasound follow-up recommended as indicated.  Electronically Signed   By: Harmon Pier M.D.   On:  01/13/2018 20:08   US Ob Transvaginal  Result Date: 01/12/2018 CLINICAL DATA:  22 y/o F; 2-3 days of spotting, progressively heavier. EXAM: OBSTETRIC <14 WK [place] AND TRANSVAGINAL OB US TECHNIQUE: Both transabdominal and transvaginal ultrasound examinations were performed for complete evaluation of the gestation as well as the maternal uterus, adnexal regions, and pelvic cul-de-sac. Transvaginal technique was performed to assess early pregnancy. COMPARISON:  None. FINDINGS: Intrauterine gestational sac: Single Yolk sac:  Visualized. Fetal pole: Not visualized. MSD: 15.1 mm   6 w   2 d Subchorionic hemorrhage:  None visualized. Maternal uterus/adnexae: Right ovary measures 3.2 x 1.9 x 2.2 cm. Left ovary measures 2.7 x 1.8 x 2.0 cm. No adnexal lesion identified. IMPRESSION: Probable early intrauterine gestational sac and yolk sac, but no fetal pole, or cardiac activity yet visualized. Recommend follow-up quantitative B-HCG levels and follow-up US in 14 days to assess viability. This recommendation follows SRU consensus guidelines: Diagnostic Criteria for Nonviable Pregnancy Early in the First Trimester. Malva Limes Med 2013; 147:8295-62. Electronically Signed   By: Mitzi Hansen M.D.   On: 01/12/2018 22:36    Assessment & Plan:  1. Follow-up visit after miscarriage Doing well. Will collect hcg to make sure levels are trending down. No further concerns at this time. Counseled on birth control. Information given.   Routine preventative health maintenance measures emphasized.  No follow-ups on file.   Caryl Ada, DO OB Fellow Center for [facility], [facility]

## 2018-02-01 ENCOUNTER — Telehealth: Payer: Self-pay | Admitting: General Practice

## 2018-02-01 DIAGNOSIS — O039 Complete or unspecified spontaneous abortion without complication: Secondary | ICD-10-CM

## 2018-02-01 NOTE — Telephone Encounter (Signed)
Called patient regarding need to come in for bhcg level. Patient verbalized understanding & states she can come 6/4 @ 815am.

## 2018-02-01 NOTE — Telephone Encounter (Signed)
-----   Message from Pincus Large, DO sent at 01/31/2018  9:50 PM EDT ----- Forgot to get hcg level on patient after visit yesterday. Please call and schedule patient for a non-stat hcg. Want to make sure her levels are back down to zero.   Thanks!

## 2018-02-06 ENCOUNTER — Other Ambulatory Visit: Payer: Medicaid Other

## 2019-05-22 DIAGNOSIS — Z3490 Encounter for supervision of normal pregnancy, unspecified, unspecified trimester: Secondary | ICD-10-CM | POA: Insufficient documentation

## 2019-05-22 LAB — OB RESULTS CONSOLE GC/CHLAMYDIA
Chlamydia: NEGATIVE
Gonorrhea: NEGATIVE

## 2019-05-22 LAB — OB RESULTS CONSOLE RPR: RPR: NONREACTIVE

## 2019-05-22 LAB — OB RESULTS CONSOLE RUBELLA ANTIBODY, IGM: Rubella: IMMUNE

## 2019-05-22 LAB — OB RESULTS CONSOLE ANTIBODY SCREEN: Antibody Screen: NEGATIVE

## 2019-05-22 LAB — OB RESULTS CONSOLE HEPATITIS B SURFACE ANTIGEN: Hepatitis B Surface Ag: NEGATIVE

## 2019-05-22 LAB — OB RESULTS CONSOLE ABO/RH: RH Type: POSITIVE

## 2019-05-22 LAB — OB RESULTS CONSOLE HIV ANTIBODY (ROUTINE TESTING): HIV: NONREACTIVE

## 2019-09-06 NOTE — L&D Delivery Note (Addendum)
Delivery Note Pt pushed for about and at 11:33 PM a viable female was delivered via Vaginal, Spontaneous (Presentation: Left Occiput Anterior).  APGAR: 8, 9; weight pending .  Pt was noted to be warm during pushing. T= 100 Placenta status: Delivered spontaneously 5 mins later, Intact. Schultz Cord: 3 vessels with the following complications:none.  Cord pH: n/a  Anesthesia: Epidural Episiotomy: None Lacerations: Periurethral;1st degree Suture Repair: 2.0 vicryl Est. Blood Loss (mL):    Mom to postpartum.  Baby to Couplet care / Skin to Skin  They do not desire circumcision for baby. Unasyn x 24hrs  Janean Sark Heru Montz 12/23/2019, 11:55 PM

## 2019-09-10 ENCOUNTER — Other Ambulatory Visit: Payer: Self-pay

## 2019-09-10 ENCOUNTER — Encounter (HOSPITAL_COMMUNITY): Payer: Self-pay | Admitting: Obstetrics and Gynecology

## 2019-09-10 ENCOUNTER — Inpatient Hospital Stay (HOSPITAL_COMMUNITY)
Admission: AD | Admit: 2019-09-10 | Discharge: 2019-09-10 | Disposition: A | Discharge status: Home / Self Care | Payer: Medicaid Other | Attending: Obstetrics and Gynecology | Admitting: Obstetrics and Gynecology

## 2019-09-10 ENCOUNTER — Inpatient Hospital Stay (HOSPITAL_COMMUNITY): Payer: Medicaid Other

## 2019-09-10 DIAGNOSIS — Z3A25 25 weeks gestation of pregnancy: Secondary | ICD-10-CM | POA: Diagnosis not present

## 2019-09-10 DIAGNOSIS — Z3689 Encounter for other specified antenatal screening: Secondary | ICD-10-CM | POA: Insufficient documentation

## 2019-09-10 DIAGNOSIS — R112 Nausea with vomiting, unspecified: Secondary | ICD-10-CM | POA: Insufficient documentation

## 2019-09-10 DIAGNOSIS — O99611 Diseases of the digestive system complicating pregnancy, first trimester: Secondary | ICD-10-CM | POA: Insufficient documentation

## 2019-09-10 DIAGNOSIS — K8020 Calculus of gallbladder without cholecystitis without obstruction: Secondary | ICD-10-CM | POA: Diagnosis not present

## 2019-09-10 DIAGNOSIS — R7989 Other specified abnormal findings of blood chemistry: Secondary | ICD-10-CM | POA: Diagnosis not present

## 2019-09-10 DIAGNOSIS — O26892 Other specified pregnancy related conditions, second trimester: Secondary | ICD-10-CM | POA: Diagnosis not present

## 2019-09-10 DIAGNOSIS — R748 Abnormal levels of other serum enzymes: Secondary | ICD-10-CM | POA: Diagnosis not present

## 2019-09-10 DIAGNOSIS — R1011 Right upper quadrant pain: Secondary | ICD-10-CM | POA: Insufficient documentation

## 2019-09-10 DIAGNOSIS — O26612 Liver and biliary tract disorders in pregnancy, second trimester: Secondary | ICD-10-CM | POA: Diagnosis not present

## 2019-09-10 DIAGNOSIS — Z79899 Other long term (current) drug therapy: Secondary | ICD-10-CM | POA: Diagnosis not present

## 2019-09-10 HISTORY — DX: Other specified health status: Z78.9

## 2019-09-10 LAB — CBC WITH DIFFERENTIAL/PLATELET
Abs Immature Granulocytes: 0.03 10*3/uL (ref 0.00–0.07)
Basophils Absolute: 0 10*3/uL (ref 0.0–0.1)
Basophils Relative: 0 %
Eosinophils Absolute: 0.1 10*3/uL (ref 0.0–0.5)
Eosinophils Relative: 1 %
HCT: 32.4 % — ABNORMAL LOW (ref 36.0–46.0)
Hemoglobin: 11.5 g/dL — ABNORMAL LOW (ref 12.0–15.0)
Immature Granulocytes: 0 %
Lymphocytes Relative: 16 %
Lymphs Abs: 1.5 10*3/uL (ref 0.7–4.0)
MCH: 31.5 pg (ref 26.0–34.0)
MCHC: 35.5 g/dL (ref 30.0–36.0)
MCV: 88.8 fL (ref 80.0–100.0)
Monocytes Absolute: 0.4 10*3/uL (ref 0.1–1.0)
Monocytes Relative: 5 %
Neutro Abs: 7 10*3/uL (ref 1.7–7.7)
Neutrophils Relative %: 78 %
Platelets: 214 10*3/uL (ref 150–400)
RBC: 3.65 MIL/uL — ABNORMAL LOW (ref 3.87–5.11)
RDW: 12.8 % (ref 11.5–15.5)
WBC: 9.1 10*3/uL (ref 4.0–10.5)
nRBC: 0 % (ref 0.0–0.2)

## 2019-09-10 LAB — LIPASE, BLOOD: Lipase: 45 U/L (ref 11–51)

## 2019-09-10 LAB — COMPREHENSIVE METABOLIC PANEL
ALT: 12 U/L (ref 0–44)
AST: 16 U/L (ref 15–41)
Albumin: 3 g/dL — ABNORMAL LOW (ref 3.5–5.0)
Alkaline Phosphatase: 62 U/L (ref 38–126)
Anion gap: 9 (ref 5–15)
BUN: <5 mg/dL — ABNORMAL LOW (ref 6–20)
CO2: 20 mmol/L — ABNORMAL LOW (ref 22–32)
Calcium: 8.5 mg/dL — ABNORMAL LOW (ref 8.9–10.3)
Chloride: 110 mmol/L (ref 98–111)
Creatinine, Ser: 0.32 mg/dL — ABNORMAL LOW (ref 0.44–1.00)
GFR calc Af Amer: >60 mL/min (ref >60)
GFR calc non Af Amer: >60 mL/min (ref >60)
Glucose, Bld: 114 mg/dL — ABNORMAL HIGH (ref 70–99)
Potassium: 3.5 mmol/L (ref 3.5–5.1)
Sodium: 139 mmol/L (ref 135–145)
Total Bilirubin: 0.1 mg/dL — ABNORMAL LOW (ref 0.3–1.2)
Total Protein: 6.2 g/dL — ABNORMAL LOW (ref 6.5–8.1)

## 2019-09-10 LAB — AMYLASE: Amylase: 122 U/L — ABNORMAL HIGH (ref 28–100)

## 2019-09-10 LAB — URINALYSIS, ROUTINE W REFLEX MICROSCOPIC
Bilirubin Urine: NEGATIVE
Glucose, UA: NEGATIVE mg/dL
Hgb urine dipstick: NEGATIVE
Ketones, ur: NEGATIVE mg/dL
Leukocytes,Ua: NEGATIVE
Nitrite: NEGATIVE
Protein, ur: NEGATIVE mg/dL
Specific Gravity, Urine: 1.006 (ref 1.005–1.030)
pH: 7 (ref 5.0–8.0)

## 2019-09-10 LAB — BILIRUBIN, DIRECT: Bilirubin, Direct: <0.1 mg/dL (ref 0.0–0.2)

## 2019-09-10 NOTE — MAU Provider Note (Signed)
History     CSN: [id number]  Arrival date and time: 09/10/19 1237   First Provider Initiated Contact with Patient 09/10/19 1415      Chief Complaint  Patient presents with  . Abdominal Pain   [name] is a 24 y.o. G2P0010 at [redacted]w[redacted]d who presents to MAU for RUQ pain. Pt reports spaghetti with red sauce and mushrooms last night and a bagel with a few bites of a chicken biscuit for breakfast this morning.  Onset: about a month ago Location: RUQ Duration: 1 months Character: at first intermittent sharp pain, about two weeks ago pt started experience intermittent numbness in area, in past few days numbness has been consistent with minimal pain, pt also experiences a burning sensation sometimes when she touches the area that radiates through to her back occasionally Aggravating/Associated: sitting/morning sickness throughout entire pregnancy that has not worsened Relieving: lying down Treatment: none Severity: 1/10  Pt denies VB, LOF, ctx, vaginal discharge/odor/itching. Pt denies constipation, diarrhea, or urinary problems. Pt denies fever, chills, fatigue, sweating or changes in appetite. Pt denies SOB or chest pain. Pt denies dizziness, HA, light-headedness, weakness.  Problems this pregnancy include: none. Allergies? NKDA Current medications/supplements? PNVs, Protonix (which has not helped, pt has been on it since) Prenatal care provider? [facility], next appt 09/24/2018   OB History    Gravida  2   Para      Term      Preterm      AB  1   Living        SAB  1   TAB      Ectopic      Multiple      Live Births              Past Medical History:  Diagnosis Date  . Medical history non-contributory     Past Surgical History:  Procedure Laterality Date  . MOUTH SURGERY      No family history on file.  Social History   Tobacco Use  . Smoking status: Never Smoker  . Smokeless tobacco: Never Used  Substance Use Topics   . Alcohol use: No  . Drug use: Never    Allergies: No Known Allergies  Medications Prior to Admission  Medication Sig Dispense Refill Last Dose  . pantoprazole (PROTONIX) 20 MG tablet Take 20 mg by mouth daily.   09/10/2019 at Unknown time  . Prenatal Vit-Fe Fumarate-FA (MULTIVITAMIN-PRENATAL) 27-0.8 MG TABS tablet Take 1 tablet by mouth daily at 12 noon.   09/10/2019 at Unknown time    Review of Systems  Constitutional: Negative for chills, diaphoresis, fatigue and fever.  Eyes: Negative for visual disturbance.  Respiratory: Negative for shortness of breath.   Cardiovascular: Negative for chest pain.  Gastrointestinal: Positive for abdominal pain, nausea and vomiting. Negative for constipation and diarrhea.  Genitourinary: Negative for dysuria, flank pain, frequency, pelvic pain, urgency, vaginal bleeding and vaginal discharge.  Neurological: Negative for dizziness, weakness, light-headedness and headaches.   Physical Exam   Blood pressure (!) 107/53, pulse 89, temperature 98.5 F (36.9 C), temperature source Oral, resp. rate 16, height 4\' 11"  (1.499 m), weight 67.9 kg, SpO2 99 %, unknown if currently breastfeeding.  Patient Vitals for the past 24 hrs:  BP Temp Temp src Pulse Resp SpO2 Height Weight  09/10/19 1410 -- -- -- -- -- 99 % -- --  09/10/19 1406 (!) 107/53 -- -- 89 -- -- -- --  09/10/19 1405 -- -- -- -- --  99 % -- --  09/10/19 1317 (!) 115/58 98.5 F (36.9 C) Oral 85 16 100 % 4\' 11"  (1.499 m) 67.9 kg   Physical Exam  Constitutional: She is oriented to person, place, and time. She appears well-developed and well-nourished. No distress.  HENT:  Head: Normocephalic and atraumatic.  Respiratory: Effort normal.  GI: Soft. She exhibits no distension and no mass. There is abdominal tenderness (pt describes tenderness on palpation as mild and describes more of a "burning" and "cold" sensation on palpation) in the right upper quadrant. There is no rebound and no guarding.   Neurological: She is alert and oriented to person, place, and time.  Skin: Skin is warm and dry. She is not diaphoretic.  Psychiatric: She has a normal mood and affect. Her behavior is normal. Judgment and thought content normal.   Results for orders placed or performed during the hospital encounter of 09/10/19 (from the past 24 hour(s))  Urinalysis, Routine w reflex microscopic     Status: Abnormal   Collection Time: 09/10/19  1:39 PM  Result Value Ref Range   Color, Urine YELLOW YELLOW   APPearance HAZY (A) CLEAR   Specific Gravity, Urine 1.006 1.005 - 1.030   pH 7.0 5.0 - 8.0   Glucose, UA NEGATIVE NEGATIVE mg/dL   Hgb urine dipstick NEGATIVE NEGATIVE   Bilirubin Urine NEGATIVE NEGATIVE   Ketones, ur NEGATIVE NEGATIVE mg/dL   Protein, ur NEGATIVE NEGATIVE mg/dL   Nitrite NEGATIVE NEGATIVE   Leukocytes,Ua NEGATIVE NEGATIVE  CBC with Differential/Platelet     Status: Abnormal   Collection Time: 09/10/19  2:29 PM  Result Value Ref Range   WBC 9.1 4.0 - 10.5 K/uL   RBC 3.65 (L) 3.87 - 5.11 MIL/uL   Hemoglobin 11.5 (L) 12.0 - 15.0 g/dL   HCT 32.4 (L) 36.0 - 46.0 %   MCV 88.8 80.0 - 100.0 fL   MCH 31.5 26.0 - 34.0 pg   MCHC 35.5 30.0 - 36.0 g/dL   RDW 12.8 11.5 - 15.5 %   Platelets 214 150 - 400 K/uL   nRBC 0.0 0.0 - 0.2 %   Neutrophils Relative % 78 %   Neutro Abs 7.0 1.7 - 7.7 K/uL   Lymphocytes Relative 16 %   Lymphs Abs 1.5 0.7 - 4.0 K/uL   Monocytes Relative 5 %   Monocytes Absolute 0.4 0.1 - 1.0 K/uL   Eosinophils Relative 1 %   Eosinophils Absolute 0.1 0.0 - 0.5 K/uL   Basophils Relative 0 %   Basophils Absolute 0.0 0.0 - 0.1 K/uL   Immature Granulocytes 0 %   Abs Immature Granulocytes 0.03 0.00 - 0.07 K/uL  Comprehensive metabolic panel     Status: Abnormal   Collection Time: 09/10/19  2:29 PM  Result Value Ref Range   Sodium 139 135 - 145 mmol/L   Potassium 3.5 3.5 - 5.1 mmol/L   Chloride 110 98 - 111 mmol/L   CO2 20 (L) 22 - 32 mmol/L   Glucose, Bld 114 (H)  70 - 99 mg/dL   BUN <5 (L) 6 - 20 mg/dL   Creatinine, Ser 0.32 (L) 0.44 - 1.00 mg/dL   Calcium 8.5 (L) 8.9 - 10.3 mg/dL   Total Protein 6.2 (L) 6.5 - 8.1 g/dL   Albumin 3.0 (L) 3.5 - 5.0 g/dL   AST 16 15 - 41 U/L   ALT 12 0 - 44 U/L   Alkaline Phosphatase 62 38 - 126 U/L   Total Bilirubin 0.1 (  L) 0.3 - 1.2 mg/dL   GFR calc non Af Amer >60 >60 mL/min   GFR calc Af Amer >60 >60 mL/min   Anion gap 9 5 - 15  Amylase     Status: Abnormal   Collection Time: 09/10/19  2:29 PM  Result Value Ref Range   Amylase 122 (H) 28 - 100 U/L  Lipase, blood     Status: None   Collection Time: 09/10/19  2:29 PM  Result Value Ref Range   Lipase 45 11 - 51 U/L  Bilirubin, direct     Status: None   Collection Time: 09/10/19  4:25 PM  Result Value Ref Range   Bilirubin, Direct <0.1 0.0 - 0.2 mg/dL   US Abdomen Limited RUQ  Result Date: 09/10/2019 CLINICAL DATA:  Twenty-five weeks pregnant. RIGHT upper quadrant pain EXAM: ULTRASOUND ABDOMEN LIMITED RIGHT UPPER QUADRANT COMPARISON:  None. FINDINGS: Gallbladder: Two large gallstones measure 10-12 mm. No gallbladder wall thickening. No pericholecystic fluid. Negative sonographic Murphy's sign. Common bile duct: Diameter: Normal at 4 mm Liver: No focal lesion identified. Within normal limits in parenchymal echogenicity. Portal vein is patent on color Doppler imaging with normal direction of blood flow towards the liver. Other: No ascites IMPRESSION: 1. Two large gallstones without evidence acute cholecystitis. 2. No biliary duct dilatation. Electronically Signed   By: Genevive Bi M.D.   On: 09/10/2019 15:16    MAU Course  Procedures  MDM -RUQ pain worse over the past few days, ongoing for 1 month -pt endorses morning sickness but states this has been happening her entire pregnancy and has not worsened -"burning" and "cold" sensation on palpation of RUQ -temp 98.5 -UA: hazy, otherwise WNL -CBC w/ Diff: WNL -CMP: serum creatinine 0.32 (low), low  bilirubin, AST/ALT 16/12 -Amylase: 122 (elevated) -Lipase: WNL but high normal -RUQ [place]: two large gallstones without evidence of acute cholecystitis, no biliary duct dilation -EFM: reactive       -baseline: 140       -variability: moderate       -accels: present, 10x10       -decels: few variables       -TOCO: irritability -consulted with Dr. Earlene Plater regarding patient history/exam/labs/US. Per Dr. Earlene Plater, no acute findings/abnormal labs to treat today with absence of worsening N/V or evidence of cholestasis/acute pancreatitis. Recommends adding bile acids to lab work and advising patient to call office tomorrow for f/u. Advise low fat diet and place outpatient consultation for GI. Pt OK to be discharged home with precautions. -pt discharged to home in stable condition  Orders Placed This Encounter  Procedures  . US Abdomen Limited RUQ    Standing Status:   Standing    Number of Occurrences:   1    Order Specific Question:   Symptom/Reason for Exam    Answer:   RUQ pain [251471]  . Urinalysis, Routine w reflex microscopic    Standing Status:   Standing    Number of Occurrences:   1  . CBC with Differential/Platelet    Standing Status:   Standing    Number of Occurrences:   1  . Comprehensive metabolic panel    Standing Status:   Standing    Number of Occurrences:   1  . Amylase    Standing Status:   Standing    Number of Occurrences:   1  . Lipase, blood    Standing Status:   Standing    Number of Occurrences:   1  .  Bile acids, total    Standing Status:   Standing    Number of Occurrences:   1  . Bilirubin, direct    Standing Status:   Standing    Number of Occurrences:   1  . Ambulatory referral to Gastroenterology    Referral Priority:   Urgent    Referral Type:   Consultation    Referral Reason:   Specialty Services Required    Number of Visits Requested:   1  . Discharge patient    Order Specific Question:   Discharge disposition    Answer:   01-Home or Self Care [1]     Order Specific Question:   Discharge patient date    Answer:   09/10/2019   No orders of the defined types were placed in this encounter.  Assessment and Plan   1. Gallstones   2. RUQ pain   3. Low serum creatinine   4. Abnormal bilirubin test   5. Elevated amylase   6. [redacted] weeks gestation of pregnancy   7. NST (non-stress test) reactive    Allergies as of 09/10/2019   No Known Allergies     Medication List    TAKE these medications   multivitamin-prenatal 27-0.8 MG Tabs tablet Take 1 tablet by mouth daily at 12 noon.   pantoprazole 20 MG tablet Commonly known as: PROTONIX Take 20 mg by mouth daily.      -will call with bile acid results, if abnormal -pt advised low fat diet, hydration -pt advised to call office tomorrow for f/u appt -referral to GI -pt advised to return with new/worsening symptoms/severe abdominal pain/fever/worsening N/V -return MAU precautions discussed for pregnancy -pt discharged to home in stable condition  Joni Reining E Shirley Bolle 09/10/2019, 5:10 PM

## 2019-09-10 NOTE — Discharge Instructions (Signed)
Abdominal Pain During Pregnancy  Abdominal pain is common during pregnancy, and has many possible causes. Some causes are more serious than others, and sometimes the cause is not known. Abdominal pain can be a sign that labor is starting. It can also be caused by normal growth and stretching of muscles and ligaments during pregnancy. Always tell your health care provider if you have any abdominal pain. Follow these instructions at home:  Do not have sex or put anything in your vagina until your pain goes away completely.  Get plenty of rest until your pain improves.  Drink enough fluid to keep your urine pale yellow.  Take over-the-counter and prescription medicines only as told by your health care provider.  Keep all follow-up visits as told by your health care provider. This is important. Contact a health care provider if:  Your pain continues or gets worse after resting.  You have lower abdominal pain that: ? Comes and goes at regular intervals. ? Spreads to your back. ? Is similar to menstrual cramps.  You have pain or burning when you urinate. Get help right away if:  You have a fever or chills.  You have vaginal bleeding.  You are leaking fluid from your vagina.  You are passing tissue from your vagina.  You have vomiting or diarrhea that lasts for more than 24 hours.  Your baby is moving less than usual.  You feel very weak or faint.  You have shortness of breath.  You develop severe pain in your upper abdomen. Summary  Abdominal pain is common during pregnancy, and has many possible causes.  If you experience abdominal pain during pregnancy, tell your health care provider right away.  Follow your health care provider's home care instructions and keep all follow-up visits as directed. This information is not intended to replace advice given to you by your health care provider. Make sure you discuss any questions you have with your health care  provider. Document Revised: 12/10/2018 Document Reviewed: 11/24/2016 Elsevier Patient Education  2020 [employer]  Cholelithiasis  Cholelithiasis is a form of gallbladder disease in which gallstones form in the gallbladder. The gallbladder is an organ that stores bile. Bile is made in the liver, and it helps to digest fats. Gallstones begin as small crystals and slowly grow into stones. They may cause no symptoms until the gallbladder tightens (contracts) and a gallstone is blocking the duct (gallbladder attack), which can cause pain. Cholelithiasis is also referred to as gallstones. There are two main types of gallstones:  Cholesterol stones. These are made of hardened cholesterol and are usually yellow-green in color. They are the most common type of gallstone. Cholesterol is a white, waxy, fat-like substance that is made in the liver.  Pigment stones. These are dark in color and are made of a red-yellow substance that forms when hemoglobin from red blood cells breaks down (bilirubin). What are the causes? This condition may be caused by an imbalance in the substances that bile is made of. This can happen if the bile:  Has too much bilirubin.  Has too much cholesterol.  Does not have enough bile salts. These salts help the body absorb and digest fats. In some cases, this condition can also be caused by the gallbladder not emptying completely or often enough. What increases the risk? The following factors may make you more likely to develop this condition:  Being female.  Having multiple pregnancies. Health care providers sometimes advise removing diseased gallbladders before future  pregnancies.  Eating a diet that is heavy in fried foods, fat, and refined carbohydrates, like white bread and white rice.  Being obese.  Being older than age 6.  Prolonged use of medicines that contain female hormones (estrogen).  Having diabetes mellitus.  Rapidly losing weight.  Having a  family history of gallstones.  Being of [place] or [place] descent.  Having an intestinal disease such as Crohn disease.  Having metabolic syndrome.  Having cirrhosis.  Having severe types of anemia such as sickle cell anemia. What are the signs or symptoms? In most cases, there are no symptoms. These are known as silent gallstones. If a gallstone blocks the bile ducts, it can cause a gallbladder attack. The main symptom of a gallbladder attack is sudden pain in the upper right abdomen. The pain usually comes at night or after eating a large meal. The pain can last for one or several hours and can spread to the right shoulder or chest. If the bile duct is blocked for more than a few hours, it can cause infection or inflammation of the gallbladder, liver, or pancreas, which may cause:  Nausea.  Vomiting.  Abdominal pain that lasts for 5 hours or more.  Fever or chills.  Yellowing of the skin or the whites of the eyes (jaundice).  Dark urine.  Light-colored stools. How is this diagnosed? This condition may be diagnosed based on:  A physical exam.  Your medical history.  An ultrasound of your gallbladder.  CT scan.  MRI.  Blood tests to check for signs of infection or inflammation.  A scan of your gallbladder and bile ducts (biliary system) using nonharmful radioactive material and special cameras that can see the radioactive material (cholescintigram). This test checks to see how your gallbladder contracts and whether bile ducts are blocked.  Inserting a small tube with a camera on the end (endoscope) through your mouth to inspect bile ducts and check for blockages (endoscopic retrograde cholangiopancreatogram). How is this treated? Treatment for gallstones depends on the severity of the condition. Silent gallstones do not need treatment. If the gallstones cause a gallbladder attack or other symptoms, treatment may be required. Options for treatment  include:  Surgery to remove the gallbladder (cholecystectomy). This is the most common treatment.  Medicines to dissolve gallstones. These are most effective at treating small gallstones. You may need to take medicines for up to 6-12 months.  Shock wave treatment (extracorporeal biliary lithotripsy). In this treatment, an ultrasound machine sends shock waves to the gallbladder to break gallstones into smaller pieces. These pieces can then be passed into the intestines or be dissolved by medicine. This is rarely used.  Removing gallstones through endoscopic retrograde cholangiopancreatogram. A small basket can be attached to the endoscope and used to capture and remove gallstones. Follow these instructions at home:  Take over-the-counter and prescription medicines only as told by your health care provider.  Maintain a healthy weight and follow a healthy diet. This includes: ? Reducing fatty foods, such as fried food. ? Reducing refined carbohydrates, like white bread and white rice. ? Increasing fiber. Aim for foods like almonds, fruit, and beans.  Keep all follow-up visits as told by your health care provider. This is important. Contact a health care provider if:  You think you have had a gallbladder attack.  You have been diagnosed with silent gallstones and you develop abdominal pain or indigestion. Get help right away if:  You have pain from a gallbladder attack that lasts  for more than 2 hours.  You have abdominal pain that lasts for more than 5 hours.  You have a fever or chills.  You have persistent nausea and vomiting.  You develop jaundice.  You have dark urine or light-colored stools. Summary  Cholelithiasis (also called gallstones) is a form of gallbladder disease in which gallstones form in the gallbladder.  This condition is caused by an imbalance in the substances that make up bile. This can happen if the bile has too much cholesterol, too much bilirubin, or not  enough bile salts.  You are more likely to develop this condition if you are female, pregnant, using medicines with estrogen, obese, older than age 7, or have a family history of gallstones. You may also develop gallstones if you have diabetes, an intestinal disease, cirrhosis, or metabolic syndrome.  Treatment for gallstones depends on the severity of the condition. Silent gallstones do not need treatment.  If gallstones cause a gallbladder attack or other symptoms, treatment may be needed. The most common treatment is surgery to remove the gallbladder. This information is not intended to replace advice given to you by your health care provider. Make sure you discuss any questions you have with your health care provider. Document Revised: 08/04/2017 Document Reviewed: 05/08/2016 Elsevier Patient Education  2020 [employer]. Second Trimester of Pregnancy The second trimester is from week 14 through week 27 (months 4 through 6). The second trimester is often a time when you feel your best. Your body has adjusted to being pregnant, and you begin to feel better physically. Usually, morning sickness has lessened or quit completely, you may have more energy, and you may have an increase in appetite. The second trimester is also a time when the fetus is growing rapidly. At the end of the sixth month, the fetus is about 9 inches long and weighs about 1 pounds. You will likely begin to feel the baby move (quickening) between 16 and 20 weeks of pregnancy. Body changes during your second trimester Your body continues to go through many changes during your second trimester. The changes vary from woman to woman.  Your weight will continue to increase. You will notice your lower abdomen bulging out.  You may begin to get stretch marks on your hips, abdomen, and breasts.  You may develop headaches that can be relieved by medicines. The medicines should be approved by your health care provider.  You may  urinate more often because the fetus is pressing on your bladder.  You may develop or continue to have heartburn as a result of your pregnancy.  You may develop constipation because certain hormones are causing the muscles that push waste through your intestines to slow down.  You may develop hemorrhoids or swollen, bulging veins (varicose veins).  You may have back pain. This is caused by: ? Weight gain. ? Pregnancy hormones that are relaxing the joints in your pelvis. ? A shift in weight and the muscles that support your balance.  Your breasts will continue to grow and they will continue to become tender.  Your gums may bleed and may be sensitive to brushing and flossing.  Dark spots or blotches (chloasma, mask of pregnancy) may develop on your face. This will likely fade after the baby is born.  A dark line from your belly button to the pubic area (linea nigra) may appear. This will likely fade after the baby is born.  You may have changes in your hair. These can include thickening of  your hair, rapid growth, and changes in texture. Some women also have hair loss during or after pregnancy, or hair that feels dry or thin. Your hair will most likely return to normal after your baby is born. What to expect at prenatal visits During a routine prenatal visit:  You will be weighed to make sure you and the fetus are growing normally.  Your blood pressure will be taken.  Your abdomen will be measured to track your baby's growth.  The fetal heartbeat will be listened to.  Any test results from the previous visit will be discussed. Your health care provider may ask you:  How you are feeling.  If you are feeling the baby move.  If you have had any abnormal symptoms, such as leaking fluid, bleeding, severe headaches, or abdominal cramping.  If you are using any tobacco products, including cigarettes, chewing tobacco, and electronic cigarettes.  If you have any questions. Other  tests that may be performed during your second trimester include:  Blood tests that check for: ? Low iron levels (anemia). ? High blood sugar that affects pregnant women (gestational diabetes) between 97 and 28 weeks. ? Rh antibodies. This is to check for a protein on red blood cells (Rh factor).  Urine tests to check for infections, diabetes, or protein in the urine.  An ultrasound to confirm the proper growth and development of the baby.  An amniocentesis to check for possible genetic problems.  Fetal screens for spina bifida and Down syndrome.  HIV (human immunodeficiency virus) testing. Routine prenatal testing includes screening for HIV, unless you choose not to have this test. Follow these instructions at home: Medicines  Follow your health care provider's instructions regarding medicine use. Specific medicines may be either safe or unsafe to take during pregnancy.  Take a prenatal vitamin that contains at least 600 micrograms (mcg) of folic acid.  If you develop constipation, try taking a stool softener if your health care provider approves. Eating and drinking   Eat a balanced diet that includes fresh fruits and vegetables, whole grains, good sources of protein such as meat, eggs, or tofu, and low-fat dairy. Your health care provider will help you determine the amount of weight gain that is right for you.  Avoid raw meat and uncooked cheese. These carry germs that can cause birth defects in the baby.  If you have low calcium intake from food, talk to your health care provider about whether you should take a daily calcium supplement.  Limit foods that are high in fat and processed sugars, such as fried and sweet foods.  To prevent constipation: ? Drink enough fluid to keep your urine clear or pale yellow. ? Eat foods that are high in fiber, such as fresh fruits and vegetables, whole grains, and beans. Activity  Exercise only as directed by your health care provider.  Most women can continue their usual exercise routine during pregnancy. Try to exercise for 30 minutes at least 5 days a week. Stop exercising if you experience uterine contractions.  Avoid heavy lifting, wear low heel shoes, and practice good posture.  A sexual relationship may be continued unless your health care provider directs you otherwise. Relieving pain and discomfort  Wear a good support bra to prevent discomfort from breast tenderness.  Take warm sitz baths to soothe any pain or discomfort caused by hemorrhoids. Use hemorrhoid cream if your health care provider approves.  Rest with your legs elevated if you have leg cramps or low back  pain.  If you develop varicose veins, wear support hose. Elevate your feet for 15 minutes, 3-4 times a day. Limit salt in your diet. Prenatal Care  Write down your questions. Take them to your prenatal visits.  Keep all your prenatal visits as told by your health care provider. This is important. Safety  Wear your seat belt at all times when driving.  Make a list of emergency phone numbers, including numbers for family, friends, the hospital, and police and fire departments. General instructions  Ask your health care provider for a referral to a local prenatal education class. Begin classes no later than the beginning of month 6 of your pregnancy.  Ask for help if you have counseling or nutritional needs during pregnancy. Your health care provider can offer advice or refer you to specialists for help with various needs.  Do not use hot tubs, steam rooms, or saunas.  Do not douche or use tampons or scented sanitary pads.  Do not cross your legs for long periods of time.  Avoid cat litter boxes and soil used by cats. These carry germs that can cause birth defects in the baby and possibly loss of the fetus by miscarriage or stillbirth.  Avoid all smoking, herbs, alcohol, and unprescribed drugs. Chemicals in these products can affect the  formation and growth of the baby.  Do not use any products that contain nicotine or tobacco, such as cigarettes and e-cigarettes. If you need help quitting, ask your health care provider.  Visit your dentist if you have not gone yet during your pregnancy. Use a soft toothbrush to brush your teeth and be gentle when you floss. Contact a health care provider if:  You have dizziness.  You have mild pelvic cramps, pelvic pressure, or nagging pain in the abdominal area.  You have persistent nausea, vomiting, or diarrhea.  You have a bad smelling vaginal discharge.  You have pain when you urinate. Get help right away if:  You have a fever.  You are leaking fluid from your vagina.  You have spotting or bleeding from your vagina.  You have severe abdominal cramping or pain.  You have rapid weight gain or weight loss.  You have shortness of breath with chest pain.  You notice sudden or extreme swelling of your face, hands, ankles, feet, or legs.  You have not felt your baby move in over an hour.  You have severe headaches that do not go away when you take medicine.  You have vision changes. Summary  The second trimester is from week 14 through week 27 (months 4 through 6). It is also a time when the fetus is growing rapidly.  Your body goes through many changes during pregnancy. The changes vary from woman to woman.  Avoid all smoking, herbs, alcohol, and unprescribed drugs. These chemicals affect the formation and growth your baby.  Do not use any tobacco products, such as cigarettes, chewing tobacco, and e-cigarettes. If you need help quitting, ask your health care provider.  Contact your health care provider if you have any questions. Keep all prenatal visits as told by your health care provider. This is important. This information is not intended to replace advice given to you by your health care provider. Make sure you discuss any questions you have with your health care  provider. Document Revised: 12/14/2018 Document Reviewed: 09/27/2016 Elsevier Patient Education  [place].

## 2019-09-10 NOTE — MAU Note (Signed)
RUQ pain, started a month ago, would come and go, was prescribed Protonix , didn't really help. Now the pain is constant, sometimes around to back, tender to touch, burning sensation.

## 2019-09-11 DIAGNOSIS — K8020 Calculus of gallbladder without cholecystitis without obstruction: Secondary | ICD-10-CM | POA: Insufficient documentation

## 2019-09-11 LAB — BILE ACIDS, TOTAL: Bile Acids Total: 2.1 umol/L (ref 0.0–10.0)

## 2019-10-18 IMAGING — US US OB TRANSVAGINAL
1 series · 15 of 28 positions shown · non-contrast
Comparison: None.

CLINICAL DATA: 22 y/o F; 2-3 days of spotting, progressively
heavier.

EXAM:
OBSTETRIC <14 WK US AND TRANSVAGINAL OB US
TECHNIQUE: Both transabdominal and transvaginal ultrasound examinations were
performed for complete evaluation of the gestation as well as the
maternal uterus, adnexal regions, and pelvic cul-de-sac.
Transvaginal technique was performed to assess early pregnancy.

[Series 1: us ob transvaginal · 54 acquisitions, 15 frames shown]
[im 1/54]
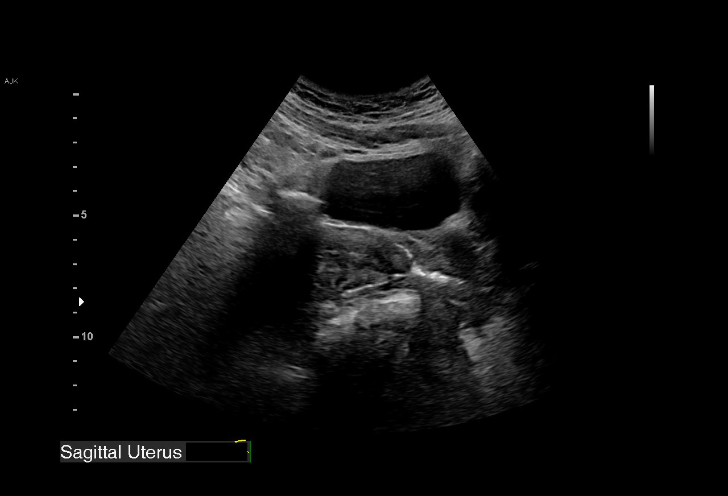
[im 4/54]
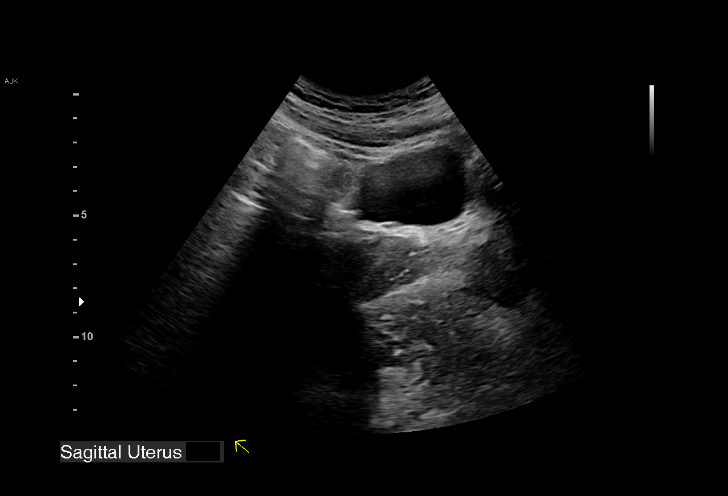
[im 8/54]
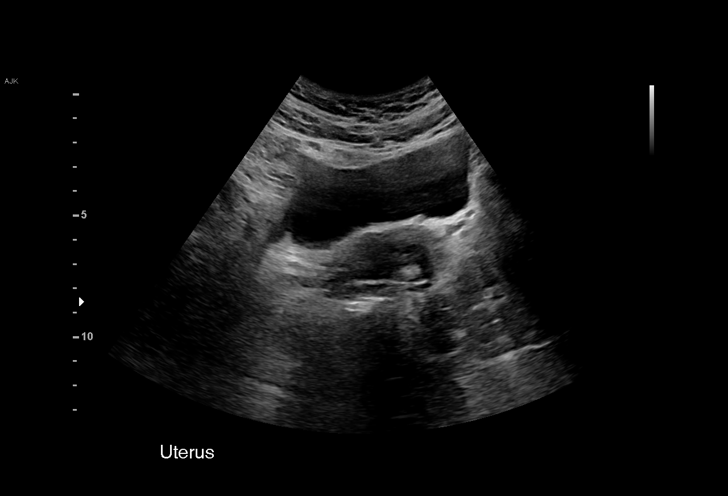
[im 12/54]
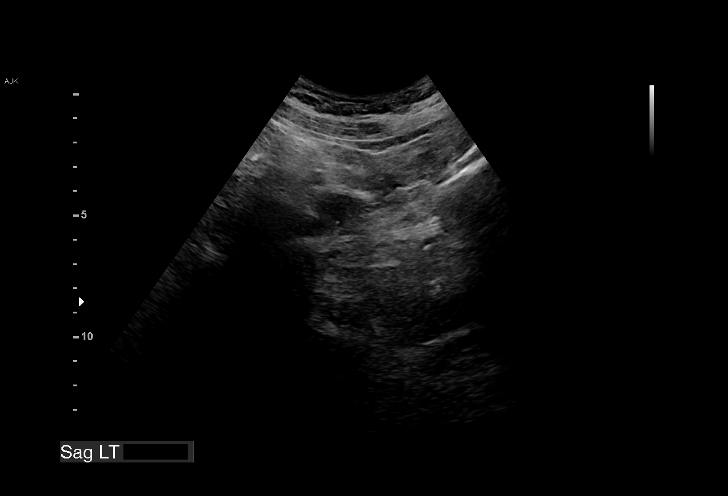
[im 16/54]
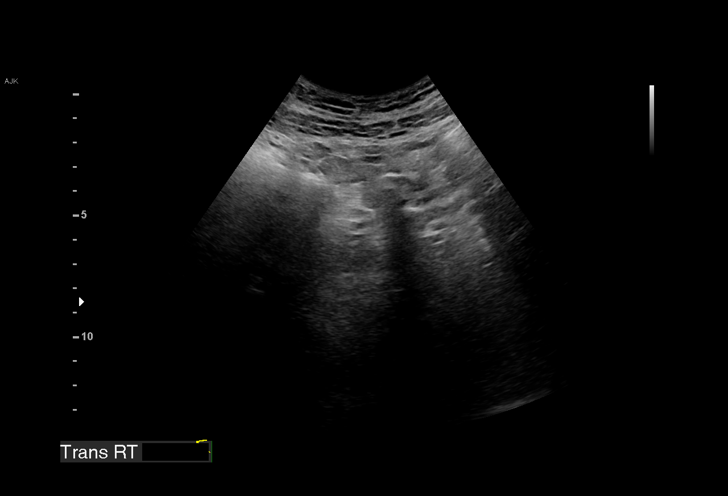
[im 20/54]
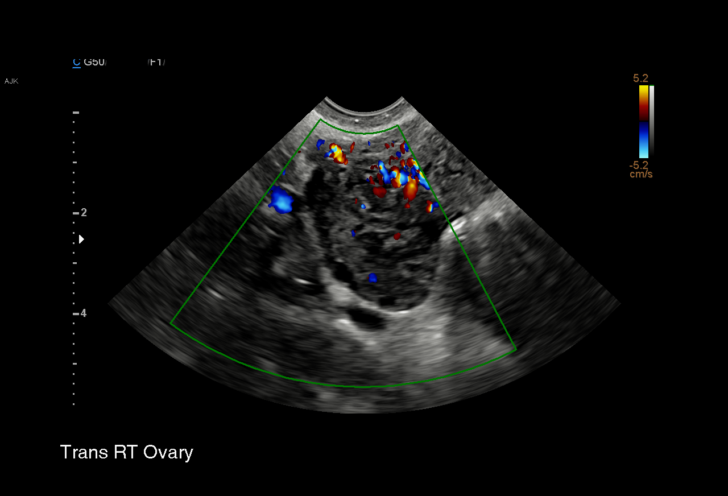
[im 24/54]
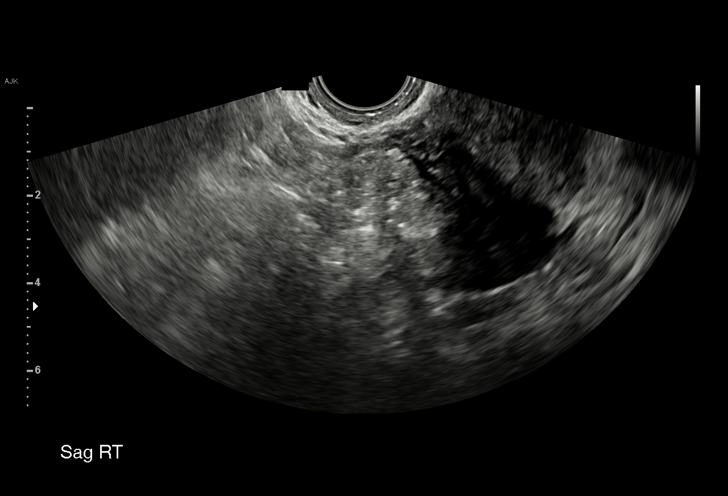
[im 28/54]
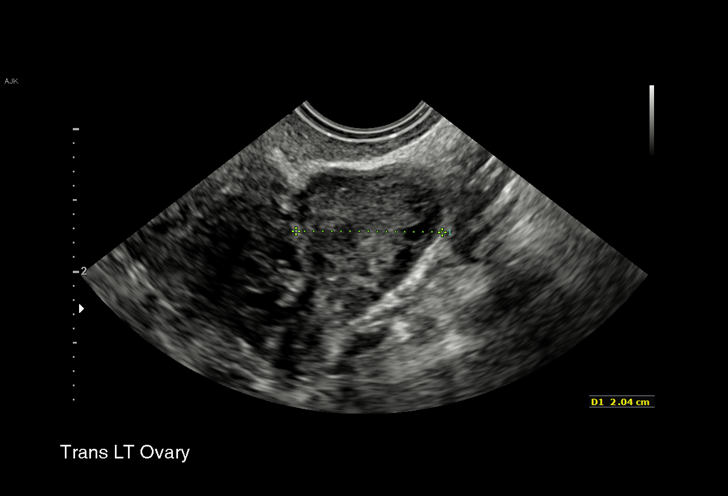
[im 30/54]
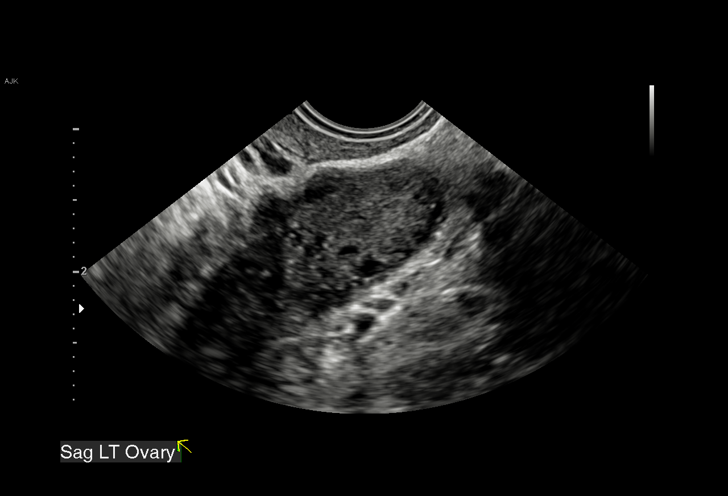
[im 34/54]
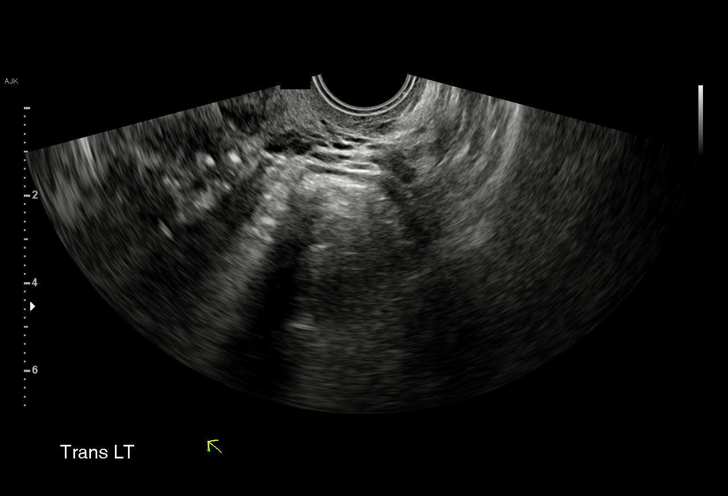
[im 38/54]
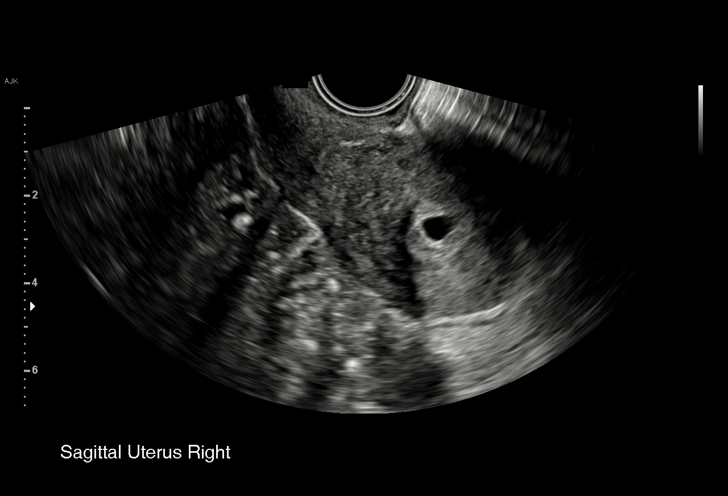
[im 42/54]
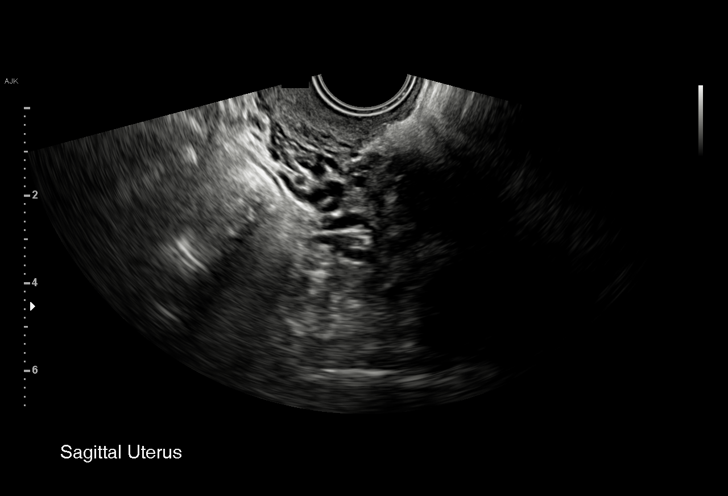
[im 46/54]
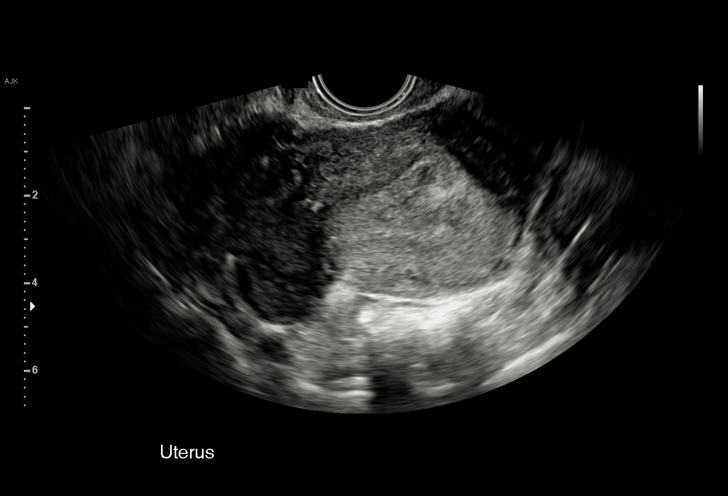
[im 50/54]
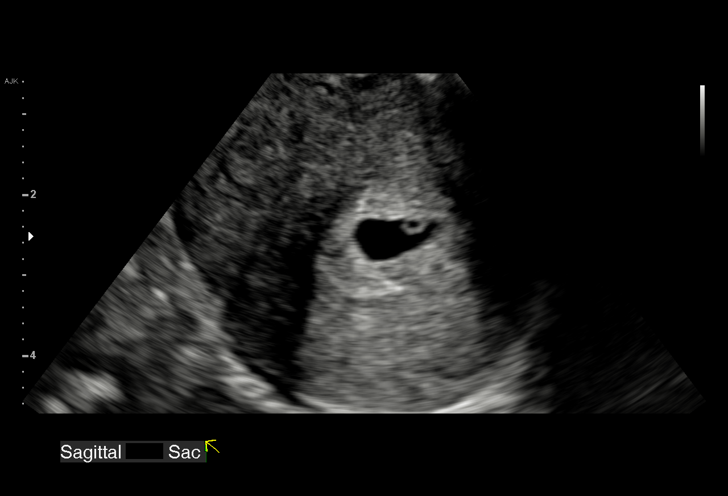
[im 54/54]
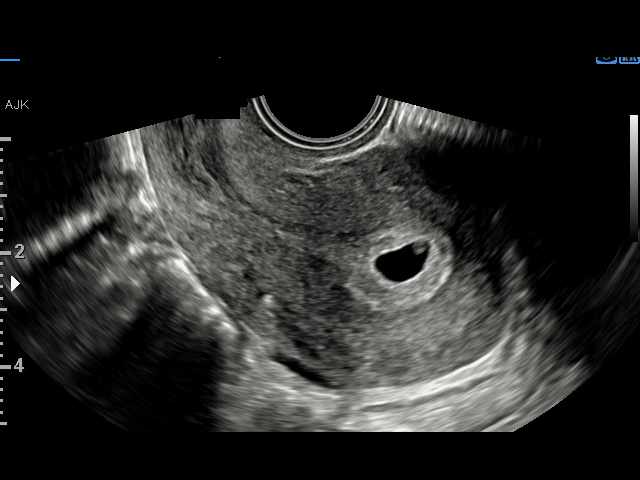

[15 of 28 positions shown; findings below may reference images not displayed]

FINDINGS: Intrauterine gestational sac: Single

Yolk sac:  Visualized.

Fetal pole: Not visualized.

MSD: 15.1 mm   6 w   2 d

Subchorionic hemorrhage:  None visualized.

Maternal uterus/adnexae: Right ovary measures 3.2 x 1.9 x 2.2 cm.
Left ovary measures 2.7 x 1.8 x 2.0 cm. No adnexal lesion
identified.
IMPRESSION: Probable early intrauterine gestational sac and yolk sac, but no
fetal pole, or cardiac activity yet visualized. Recommend follow-up
quantitative B-HCG levels and follow-up US in 14 days to assess
viability. This recommendation follows SRU consensus guidelines:
Diagnostic Criteria for Nonviable Pregnancy Early in the First
Trimester. N Engl J Med 0415; [DATE].

By: Richard Sebastian Melani M.D.

## 2019-12-23 ENCOUNTER — Encounter (HOSPITAL_COMMUNITY): Payer: Self-pay | Admitting: Obstetrics and Gynecology

## 2019-12-23 ENCOUNTER — Inpatient Hospital Stay (HOSPITAL_COMMUNITY): Payer: Medicaid Other | Admitting: Anesthesiology

## 2019-12-23 ENCOUNTER — Other Ambulatory Visit: Payer: Self-pay

## 2019-12-23 ENCOUNTER — Inpatient Hospital Stay (HOSPITAL_COMMUNITY)
Admission: AD | Admit: 2019-12-23 | Discharge: 2019-12-25 | DRG: 805 | Disposition: A | Discharge status: Home / Self Care | Payer: Medicaid Other | Attending: Obstetrics and Gynecology | Admitting: Obstetrics and Gynecology

## 2019-12-23 DIAGNOSIS — O9962 Diseases of the digestive system complicating childbirth: Principal | ICD-10-CM | POA: Diagnosis present

## 2019-12-23 DIAGNOSIS — O4202 Full-term premature rupture of membranes, onset of labor within 24 hours of rupture: Secondary | ICD-10-CM

## 2019-12-23 DIAGNOSIS — O411230 Chorioamnionitis, third trimester, not applicable or unspecified: Secondary | ICD-10-CM | POA: Diagnosis present

## 2019-12-23 DIAGNOSIS — Z3A40 40 weeks gestation of pregnancy: Secondary | ICD-10-CM

## 2019-12-23 DIAGNOSIS — O411290 Chorioamnionitis, unspecified trimester, not applicable or unspecified: Secondary | ICD-10-CM

## 2019-12-23 DIAGNOSIS — Z0371 Encounter for suspected problem with amniotic cavity and membrane ruled out: Secondary | ICD-10-CM

## 2019-12-23 DIAGNOSIS — Z20822 Contact with and (suspected) exposure to covid-19: Secondary | ICD-10-CM | POA: Diagnosis present

## 2019-12-23 DIAGNOSIS — O26893 Other specified pregnancy related conditions, third trimester: Secondary | ICD-10-CM | POA: Diagnosis present

## 2019-12-23 DIAGNOSIS — K8020 Calculus of gallbladder without cholecystitis without obstruction: Secondary | ICD-10-CM | POA: Diagnosis present

## 2019-12-23 LAB — TYPE AND SCREEN
ABO/RH(D): O POS
Antibody Screen: NEGATIVE

## 2019-12-23 LAB — CBC
HCT: 39.5 % (ref 36.0–46.0)
Hemoglobin: 13 g/dL (ref 12.0–15.0)
MCH: 28.4 pg (ref 26.0–34.0)
MCHC: 32.9 g/dL (ref 30.0–36.0)
MCV: 86.2 fL (ref 80.0–100.0)
Platelets: 199 10*3/uL (ref 150–400)
RBC: 4.58 MIL/uL (ref 3.87–5.11)
RDW: 14.6 % (ref 11.5–15.5)
WBC: 12.3 10*3/uL — ABNORMAL HIGH (ref 4.0–10.5)
nRBC: 0 % (ref 0.0–0.2)

## 2019-12-23 LAB — POCT FERN TEST: POCT Fern Test: NEGATIVE

## 2019-12-23 LAB — RESPIRATORY PANEL BY RT PCR (FLU A&B, COVID)
Influenza A by PCR: NEGATIVE
Influenza B by PCR: NEGATIVE
SARS Coronavirus 2 by RT PCR: NEGATIVE

## 2019-12-23 LAB — ABO/RH: ABO/RH(D): O POS

## 2019-12-23 MED ORDER — OXYCODONE-ACETAMINOPHEN 5-325 MG PO TABS: 1.0000 | ORAL_TABLET | ORAL | Status: DC | PRN

## 2019-12-23 MED ORDER — ONDANSETRON HCL 4 MG/2ML IJ SOLN
4.0000 mg | Freq: Four times a day (QID) | INTRAMUSCULAR | Status: DC | PRN
  Administered 2019-12-23: 4 mg via INTRAVENOUS
  Filled 2019-12-23: qty 2

## 2019-12-23 MED ORDER — DIPHENHYDRAMINE HCL 50 MG/ML IJ SOLN: 12.5000 mg | INTRAMUSCULAR | Status: DC | PRN

## 2019-12-23 MED ORDER — PHENYLEPHRINE 40 MCG/ML (10ML) SYRINGE FOR IV PUSH (FOR BLOOD PRESSURE SUPPORT): 80.0000 ug | PREFILLED_SYRINGE | INTRAVENOUS | Status: DC | PRN

## 2019-12-23 MED ORDER — OXYCODONE-ACETAMINOPHEN 5-325 MG PO TABS: 2.0000 | ORAL_TABLET | ORAL | Status: DC | PRN

## 2019-12-23 MED ORDER — FENTANYL-BUPIVACAINE-NACL 0.5-0.125-0.9 MG/250ML-% EP SOLN
12.0000 mL/h | EPIDURAL | Status: DC | PRN
  Filled 2019-12-23: qty 250

## 2019-12-23 MED ORDER — SOD CITRATE-CITRIC ACID 500-334 MG/5ML PO SOLN: 30.0000 mL | ORAL | Status: DC | PRN

## 2019-12-23 MED ORDER — LACTATED RINGERS IV SOLN: INTRAVENOUS | Status: DC

## 2019-12-23 MED ORDER — LIDOCAINE HCL (PF) 1 % IJ SOLN: 30.0000 mL | INTRAMUSCULAR | Status: DC | PRN

## 2019-12-23 MED ORDER — TERBUTALINE SULFATE 1 MG/ML IJ SOLN: 0.2500 mg | Freq: Once | INTRAMUSCULAR | Status: DC | PRN

## 2019-12-23 MED ORDER — LIDOCAINE HCL (PF) 1 % IJ SOLN
INTRAMUSCULAR | Status: DC | PRN
  Administered 2019-12-23: 11 mL via EPIDURAL

## 2019-12-23 MED ORDER — EPHEDRINE 5 MG/ML INJ: 10.0000 mg | INTRAVENOUS | Status: DC | PRN

## 2019-12-23 MED ORDER — ACETAMINOPHEN 325 MG PO TABS: 650.0000 mg | ORAL_TABLET | ORAL | Status: DC | PRN

## 2019-12-23 MED ORDER — FENTANYL CITRATE (PF) 100 MCG/2ML IJ SOLN: 50.0000 ug | INTRAMUSCULAR | Status: DC | PRN

## 2019-12-23 MED ORDER — LACTATED RINGERS IV SOLN
500.0000 mL | Freq: Once | INTRAVENOUS | Status: AC
  Administered 2019-12-23: 500 mL via INTRAVENOUS

## 2019-12-23 MED ORDER — OXYTOCIN 40 UNITS IN NORMAL SALINE INFUSION - SIMPLE MED
1.0000 m[IU]/min | INTRAVENOUS | Status: DC
  Administered 2019-12-23: 2 m[IU]/min via INTRAVENOUS

## 2019-12-23 MED ORDER — OXYTOCIN BOLUS FROM INFUSION: 500.0000 mL | Freq: Once | INTRAVENOUS | Status: DC

## 2019-12-23 MED ORDER — LACTATED RINGERS IV SOLN
500.0000 mL | INTRAVENOUS | Status: DC | PRN
  Administered 2019-12-23: 500 mL via INTRAVENOUS

## 2019-12-23 MED ORDER — FLEET ENEMA 7-19 GM/118ML RE ENEM: 1.0000 | ENEMA | RECTAL | Status: DC | PRN

## 2019-12-23 MED ORDER — OXYTOCIN 40 UNITS IN NORMAL SALINE INFUSION - SIMPLE MED
2.5000 [IU]/h | INTRAVENOUS | Status: DC
  Filled 2019-12-23: qty 1000

## 2019-12-23 MED ORDER — SODIUM CHLORIDE (PF) 0.9 % IJ SOLN
INTRAMUSCULAR | Status: DC | PRN
  Administered 2019-12-23: 12 mL/h via EPIDURAL

## 2019-12-23 NOTE — MAU Note (Signed)
Lindsay Richardson is a 24 y.o. at [redacted]w[redacted]d here in MAU reporting: started spotting around 0300 and then started cramping every 10 minutes. Around 0500 they started getting closer. Now they are about every 5 minutes. Has noticed spotting 1 more time but not every time she goes to the bathroom. Having some clear LOF since yesterday, yesterday it leaked down her leg and today it is less but still seeing some. +FM. Has been 0.5-1 cm in the office.  Onset of complaint: yesterday  Pain score: 6/10  Vitals:   12/23/19 0844  BP: 102/66  Pulse: 83  Resp: 20  Temp: 98.2 F (36.8 C)  SpO2: 100%     FHT: +FM  Lab orders placed from triage: none

## 2019-12-23 NOTE — MAU Provider Note (Signed)
First Provider Initiated Contact with Patient 12/23/19 [phone]       S: [name] is a 24 y.o. G2P0010 at [redacted]w[redacted]d  who presents to MAU today complaining of leaking of fluid since yesterday. She denies vaginal bleeding. She endorses contractions. She reports normal fetal movement.    O: BP 102/66 (BP Location: Right Arm)   Pulse 83   Temp 98.2 F (36.8 C) (Oral)   Resp 20   Ht 4\' 11"  (1.499 m)   Wt 73.9 kg   SpO2 100% Comment: room air  BMI 32.92 kg/m  GENERAL: Well-developed, well-nourished female in no acute distress.  HEAD: Normocephalic, atraumatic.  CHEST: Normal effort of breathing, regular heart rate ABDOMEN: Soft, nontender, gravid PELVIC: Normal external female genitalia. Vagina is pink and rugated. Cervix with normal contour, no lesions. Normal discharge.  No pooling.   Cervical exam:  Dilation: 3.5 Effacement (%): 70 Cervical Position: Posterior Station: -2 Presentation: Vertex Exam by:: [email or web address] NP   Fetal Monitoring: Baseline: 140 Variability: moderate Accelerations: 15x15 Decelerations: mild variable Contractions: difficult to monitor due to maternal position  Results for orders placed or performed during the hospital encounter of 12/23/19 (from the past 24 hour(s))  POCT fern test     Status: None   Collection Time: 12/23/19  9:47 AM  Result Value Ref Range   POCT Fern Test Negative = intact amniotic membranes      A: [facility] at [redacted]w[redacted]d  Membranes intact   P: Admit to birthing suites Patient very uncomfortable & has made minimal change from 3-3.5. Some bloody show and baby is low.  Discussed with Dr. [redacted]w[redacted]d - ok to admit if that's patient's preference.  GBS negative  Mindi Slicker, NP 12/23/2019 11:17 AM

## 2019-12-23 NOTE — Progress Notes (Signed)
Patient ID: Lindsay Richardson, female   DOB: [birth year], 24 y.o.   MRN: [id number] Pt was complete at 10pm but with no urge to push and not appreciating contractions.  Cat 1 strip Allowed pt to labor down for an hour.  Trial pushing now shows some descent. Will plan for imminent vaginal delivery

## 2019-12-23 NOTE — Anesthesia Procedure Notes (Signed)
Epidural Patient location during procedure: OB Start time: 12/23/2019 12:15 PM End time: 12/23/2019 12:23 PM  Staffing Anesthesiologist: Lowella Curb, MD Performed: anesthesiologist   Preanesthetic Checklist Completed: patient identified, IV checked, site marked, risks and benefits discussed, surgical consent, monitors and equipment checked, pre-op evaluation and timeout performed  Epidural Patient position: sitting Prep: ChloraPrep Patient monitoring: heart rate, cardiac monitor, continuous pulse ox and blood pressure Approach: midline Location: L2-L3 Injection technique: LOR saline  Needle:  Needle type: Tuohy  Needle gauge: 17 G Needle length: 9 cm Needle insertion depth: 6 cm Catheter type: closed end flexible Catheter size: 20 Guage Catheter at skin depth: 10 cm Test dose: negative  Assessment Events: blood not aspirated, injection not painful, no injection resistance, no paresthesia and negative IV test  Additional Notes Epidural placed by SRNA under direct supervisionReason for block:procedure for pain

## 2019-12-23 NOTE — H&P (Signed)
Lindsay Richardson is a 24 y.[id number] female presenting for painful contractions and leakage of fluid. Pt not noted to be ruptured but in early labor. She is dated per LMP - confirmed with a 9 week [place]. She had gallstones in early pregnancy. Neg essential and first trimester screen. GBS neg. OB History    Gravida  2   Para      Term      Preterm      AB  1   Living        SAB  1   TAB      Ectopic      Multiple      Live Births             Past Medical History:  Diagnosis Date  . Medical history non-contributory    Past Surgical History:  Procedure Laterality Date  . MOUTH SURGERY     Family History: family history is not on file. Social History:  reports that she has never smoked. She has never used smokeless tobacco. She reports that she does not drink alcohol or use drugs.     Maternal Diabetes: No Genetic Screening: Normal Maternal Ultrasounds/Referrals: Normal Fetal Ultrasounds or other Referrals:  None Maternal Substance Abuse:  No Significant Maternal Medications:  None Significant Maternal Lab Results:  Group B Strep negative Other Comments:  None  Review of Systems  Constitutional: Positive for fatigue. Negative for activity change and appetite change.  Eyes: Negative for visual disturbance.  Respiratory: Negative for chest tightness and shortness of breath.   Cardiovascular: Positive for leg swelling. Negative for chest pain and palpitations.  Gastrointestinal: Positive for abdominal pain. Negative for nausea.  Musculoskeletal: Negative for myalgias.  Neurological: Negative for headaches.  Psychiatric/Behavioral: The patient is not nervous/anxious.    Maternal Medical History:  Reason for admission: Contractions.  Nausea.  Contractions: Onset was 3-5 hours ago.   Frequency: irregular.   Perceived severity is strong.    Fetal activity: Perceived fetal activity is normal.   Last perceived fetal movement was within the past hour.     Prenatal complications: no prenatal complications Prenatal Complications - Diabetes: none.    Dilation: 4.5 Effacement (%): 90 Station: -2 Exam by:: k fields, rn Blood pressure 106/72, pulse 85, temperature 98.2 F (36.8 C), temperature source Oral, resp. rate 16, height 4\' 11"  (1.499 m), weight 73.9 kg, SpO2 99 %, unknown if currently breastfeeding. Maternal Exam:  Uterine Assessment: Contraction strength is moderate.  Contraction frequency is irregular.   Abdomen: Patient reports generalized tenderness.  Estimated fetal weight is AGA.   Fetal presentation: vertex  Introitus: Normal vulva. Vulva is negative for condylomata and lesion.  Normal vagina.  Vagina is negative for condylomata and discharge.  Pelvis: adequate for delivery.   Cervix: Cervix evaluated by digital exam.     Fetal Exam Fetal Monitor Review: Baseline rate: 140.  Variability: moderate (6-25 bpm).   Pattern: accelerations present and no decelerations.    Fetal State Assessment: Category I - tracings are normal.     Physical Exam  Constitutional: She is oriented to person, place, and time. She appears well-developed and well-nourished.  Respiratory: Effort normal.  GI: Soft. There is generalized abdominal tenderness.  Genitourinary:    Vulva, vagina and uterus normal.     No vulval condylomata or lesion noted.     No vaginal discharge.   Musculoskeletal:        General: Normal range of motion.  Cervical back: Normal range of motion.  Neurological: She is alert and oriented to person, place, and time.  Skin: Skin is warm.  Psychiatric: She has a normal mood and affect. Her behavior is normal. Judgment and thought content normal.    Prenatal labs: ABO, Rh: --/--/O POS, O POS Performed at [facility], [address], [place], [place] [address]  [phone] 1112) Antibody: NEG (04/19 1112) Rubella: Immune (09/16 0000) RPR: Nonreactive (09/16 0000)  HBsAg: Negative (09/16 0000)  HIV:  Non-reactive (09/16 0000)  GBS:     Assessment/Plan: 24yo G33P0010 female admitted in early labor -Admit to L/D -Epidural per pt request - AROM now - Augment if needed -GBS neg - Sars covid neg -Anticipate svd   Lindsay Richardson Lindsay Richardson 12/23/2019, 1:32 PM

## 2019-12-23 NOTE — Anesthesia Preprocedure Evaluation (Signed)

## 2019-12-24 ENCOUNTER — Encounter (HOSPITAL_COMMUNITY): Payer: Self-pay | Admitting: Obstetrics and Gynecology

## 2019-12-24 DIAGNOSIS — O411290 Chorioamnionitis, unspecified trimester, not applicable or unspecified: Secondary | ICD-10-CM

## 2019-12-24 LAB — CBC
HCT: 32.4 % — ABNORMAL LOW (ref 36.0–46.0)
Hemoglobin: 10.7 g/dL — ABNORMAL LOW (ref 12.0–15.0)
MCH: 28.4 pg (ref 26.0–34.0)
MCHC: 33 g/dL (ref 30.0–36.0)
MCV: 85.9 fL (ref 80.0–100.0)
Platelets: 176 10*3/uL (ref 150–400)
RBC: 3.77 MIL/uL — ABNORMAL LOW (ref 3.87–5.11)
RDW: 15 % (ref 11.5–15.5)
WBC: 20.9 10*3/uL — ABNORMAL HIGH (ref 4.0–10.5)
nRBC: 0 % (ref 0.0–0.2)

## 2019-12-24 LAB — RPR: RPR Ser Ql: NONREACTIVE

## 2019-12-24 MED ORDER — ACETAMINOPHEN 325 MG PO TABS
650.0000 mg | ORAL_TABLET | ORAL | Status: DC | PRN
  Administered 2019-12-24: 650 mg via ORAL
  Filled 2019-12-24: qty 2

## 2019-12-24 MED ORDER — SIMETHICONE 80 MG PO CHEW: 80.0000 mg | CHEWABLE_TABLET | ORAL | Status: DC | PRN

## 2019-12-24 MED ORDER — BENZOCAINE-MENTHOL 20-0.5 % EX AERO
1.0000 "application " | INHALATION_SPRAY | CUTANEOUS | Status: DC | PRN
  Administered 2019-12-24: 1 via TOPICAL
  Filled 2019-12-24: qty 56

## 2019-12-24 MED ORDER — SODIUM CHLORIDE 0.9 % IV SOLN
1.5000 g | Freq: Four times a day (QID) | INTRAVENOUS | Status: AC
  Administered 2019-12-24 (×2): 1.5 g via INTRAVENOUS
  Filled 2019-12-24: qty 1.5
  Filled 2019-12-24: qty 4
  Filled 2019-12-24: qty 1.5
  Filled 2019-12-24: qty 4

## 2019-12-24 MED ORDER — SENNOSIDES-DOCUSATE SODIUM 8.6-50 MG PO TABS
2.0000 | ORAL_TABLET | ORAL | Status: DC
  Administered 2019-12-24: 2 via ORAL
  Filled 2019-12-24: qty 2

## 2019-12-24 MED ORDER — OXYCODONE HCL 5 MG PO TABS: 5.0000 mg | ORAL_TABLET | ORAL | Status: DC | PRN

## 2019-12-24 MED ORDER — ZOLPIDEM TARTRATE 5 MG PO TABS: 5.0000 mg | ORAL_TABLET | Freq: Every evening | ORAL | Status: DC | PRN

## 2019-12-24 MED ORDER — TETANUS-DIPHTH-ACELL PERTUSSIS 5-2.5-18.5 LF-MCG/0.5 IM SUSP: 0.5000 mL | Freq: Once | INTRAMUSCULAR | Status: DC

## 2019-12-24 MED ORDER — COCONUT OIL OIL: 1.0000 "application " | TOPICAL_OIL | Status: DC | PRN

## 2019-12-24 MED ORDER — DIBUCAINE (PERIANAL) 1 % EX OINT: 1.0000 "application " | TOPICAL_OINTMENT | CUTANEOUS | Status: DC | PRN

## 2019-12-24 MED ORDER — WITCH HAZEL-GLYCERIN EX PADS: 1.0000 "application " | MEDICATED_PAD | CUTANEOUS | Status: DC | PRN

## 2019-12-24 MED ORDER — OXYCODONE HCL 5 MG PO TABS: 10.0000 mg | ORAL_TABLET | ORAL | Status: DC | PRN

## 2019-12-24 MED ORDER — LACTATED RINGERS IV SOLN: INTRAVENOUS | Status: AC

## 2019-12-24 MED ORDER — DIPHENHYDRAMINE HCL 25 MG PO CAPS: 25.0000 mg | ORAL_CAPSULE | Freq: Four times a day (QID) | ORAL | Status: DC | PRN

## 2019-12-24 MED ORDER — ONDANSETRON HCL 4 MG/2ML IJ SOLN: 4.0000 mg | INTRAMUSCULAR | Status: DC | PRN

## 2019-12-24 MED ORDER — IBUPROFEN 600 MG PO TABS
600.0000 mg | ORAL_TABLET | Freq: Four times a day (QID) | ORAL | Status: DC
  Administered 2019-12-24 – 2019-12-25 (×6): 600 mg via ORAL
  Filled 2019-12-24 (×6): qty 1

## 2019-12-24 MED ORDER — ONDANSETRON HCL 4 MG PO TABS: 4.0000 mg | ORAL_TABLET | ORAL | Status: DC | PRN

## 2019-12-24 MED ORDER — PRENATAL MULTIVITAMIN CH
1.0000 | ORAL_TABLET | Freq: Every day | ORAL | Status: DC
  Administered 2019-12-24 – 2019-12-25 (×2): 1 via ORAL
  Filled 2019-12-24 (×2): qty 1

## 2019-12-24 NOTE — Lactation Note (Signed)
This note was copied from a baby's chart. Lactation Consultation Note  Patient Name: Lindsay Richardson [id number] Date: 12/24/2019 Reason for consult: Initial assessment;Primapara;1st time breastfeeding;Term  Baby is 14 hours old  As LC entered the room mom trying to latch the baby. LC offered to assist with the orientee.  Assisting with basics breast feeding teaching,, hand expressing, depth at the breast.  This is moms 1st baby, and desires to breast feed. Reports she had some breast feeding Changes with pregnancy 1st trimester, more 3rd trimester.  Swallows noted with feeding , comfort and depth with latch, nipple well rounded when baby released on his own.  Per mom has a DEBP Lansinoh at home.  LC provided the [facility] Pamphlet for after D/C .    Maternal Data Has patient been taught Hand Expression?: Yes Does the patient have breastfeeding experience prior to this delivery?: No  Feeding Feeding Type: Breast Fed  LATCH Score Latch: Repeated attempts needed to sustain latch, nipple held in mouth throughout feeding, stimulation needed to elicit sucking reflex.  Audible Swallowing: A few with stimulation  Type of Nipple: Everted at rest and after stimulation  Comfort (Breast/Nipple): Soft / non-tender  Hold (Positioning): Assistance needed to correctly position infant at breast and maintain latch.  LATCH Score: 7  Interventions Interventions: Breast feeding basics reviewed;Assisted with latch;Skin to skin;Breast massage;Hand express;Breast compression;Adjust position;Support pillows;Position options  Lactation Tools Discussed/Used WIC Program: Yes   Consult Status Consult Status: Follow-up Date: 12/24/19 Follow-up type: In-patient    Lindsay Richardson 12/24/2019, 2:30 PM

## 2019-12-24 NOTE — Anesthesia Postprocedure Evaluation (Signed)
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) Performed: AN AD HOC LABOR EPIDURAL     Patient location during evaluation: Mother Baby Anesthesia Type: Epidural Level of consciousness: awake and alert and oriented Pain management: satisfactory to patient Vital Signs Assessment: post-procedure vital signs reviewed and stable Respiratory status: respiratory function stable Cardiovascular status: stable Postop Assessment: no headache, no backache, epidural receding, patient able to bend at knees, no signs of nausea or vomiting and adequate PO intake Anesthetic complications: no    Last Vitals:  Vitals:   12/24/19 0554 12/24/19 0726  BP: (!) 108/51 (!) 105/58  Pulse: 61 70  Resp: 17 16  Temp: 36.7 C 36.6 C  SpO2:  99%    Last Pain:  Vitals:   12/24/19 0900  TempSrc:   PainSc: 0-No pain   Pain Goal:                   Lindsay Richardson

## 2019-12-24 NOTE — Lactation Note (Signed)
This note was copied from a baby's chart. Lactation Consultation Note Attempted to see mom, mom sleeping.  Patient Name: Lindsay Richardson [id number] Date: 12/24/2019     Maternal Data    Feeding Feeding Type: Breast Fed  LATCH Score Latch: Grasps breast easily, tongue down, lips flanged, rhythmical sucking.  Audible Swallowing: Spontaneous and intermittent  Type of Nipple: Everted at rest and after stimulation  Comfort (Breast/Nipple): Soft / non-tender  Hold (Positioning): Assistance needed to correctly position infant at breast and maintain latch.  LATCH Score: 9  Interventions    Lactation Tools Discussed/Used     Consult Status      Miranda Garber G 12/24/2019, 5:03 AM

## 2019-12-24 NOTE — Progress Notes (Signed)
Post Partum Day 1 Subjective: no complaints, up ad lib, voiding, tolerating PO and nl lochia, pain controlled  Objective: Blood pressure (!) 105/58, pulse 70, temperature 97.9 F (36.6 C), temperature source Oral, resp. rate 16, height 4\' 11"  (1.499 m), weight 73.9 kg, SpO2 99 %, unknown if currently breastfeeding.  Physical Exam:  General: alert and no distress Lochia: appropriate Uterine Fundus: firm Incision: healing well DVT Evaluation: No evidence of DVT seen on physical exam.  Recent Labs    12/23/19 1123 12/24/19 0611  HGB 13.0 10.7*  HCT 39.5 32.4*    Assessment/Plan: Plan for discharge tomorrow, Breastfeeding and Lactation consult.  Routine PP care.     LOS: 1 day   Lindsay Richardson 12/24/2019, 7:45 AM

## 2019-12-25 MED ORDER — IBUPROFEN 800 MG PO TABS: 800.0000 mg | ORAL_TABLET | Freq: Three times a day (TID) | ORAL | 1 refills | Status: DC | PRN

## 2019-12-25 NOTE — Progress Notes (Signed)
Post Partum Day 2 Subjective: no complaints, up ad lib, voiding, tolerating PO and nl lochia, pain controlled. Denies fevers, chills. Reviewed IUD options, desires Paragard at postpartum  Objective: Blood pressure 107/84, pulse 84, temperature 97.9 F (36.6 C), temperature source Oral, resp. rate 18, height 4\' 11"  (1.499 m), weight 73.9 kg, SpO2 99 %, unknown if currently breastfeeding.  Physical Exam:  General: alert and no distress Lochia: appropriate Uterine Fundus: firm DVT Evaluation: No evidence of DVT seen on physical exam.  Recent Labs    12/23/19 1123 12/24/19 0611  HGB 13.0 10.7*  HCT 39.5 32.4*    Assessment/Plan: Discharge home, Breastfeeding, Lactation consult and Contraception Paragard.  Routine PP care.   No circ Suspected Triple I - s/p 24hrs Unasyn, benign fundal exam, VSS   LOS: 2 days   12/26/19 12/25/2019, 10:04 AM

## 2019-12-25 NOTE — Discharge Summary (Signed)
OB Discharge Summary     Patient Name: Lindsay Richardson DOB: 03/19/1996 MRN: [id number]  Date of admission: 12/23/2019 Delivering MD: Carlynn Purl [facility]   Date of discharge: 12/25/2019  Admitting diagnosis: Indication for care in labor and delivery, antepartum [O75.9] Intrauterine pregnancy: [redacted]w[redacted]d     Secondary diagnosis:  Active Problems:   Indication for care in labor and delivery, antepartum   SVD (spontaneous vaginal delivery)   Chorioamnionitis  Additional problems: none     Discharge diagnosis: Term Pregnancy Delivered                                                                                                Post partum procedures:none  Augmentation: AROM  Complications: Intrauterine Inflammation or infection (Chorioamniotis)  Hospital course:  Onset of Labor With Vaginal Delivery     24 y.o. yo G2P1011 at [redacted]w[redacted]d was admitted in Active Labor on 12/23/2019. Patient had an uncomplicated labor course as follows:  Membrane Rupture Time/Date: 2:00 PM ,12/23/2019   Intrapartum Procedures: Episiotomy: None [1]                                         Lacerations:  Periurethral [8];1st degree [2]  Patient had a delivery of a Viable infant. 12/23/2019  Information for the patient's newborn:  Keona, Bilyeu [[id number]]  Delivery Method: Vaginal, Spontaneous(Filed from Delivery Summary)    Noted to have temp during second stage of 100.25F, completed 24hrs Unasyn, no complaints at discharge.  Pateint had an uncomplicated postpartum course.  She is ambulating, tolerating a regular diet, passing flatus, and urinating well. Patient is discharged home in stable condition on 12/25/19.   Physical exam  Vitals:   12/24/19 1106 12/24/19 1555 12/24/19 2148 12/25/19 0522  BP: (!) 92/57 (!) 109/55 101/63 107/84  Pulse: 63 62 65 84  Resp: 17 16 17 18   Temp: 98 F (36.7 C) 98.4 F (36.9 C) 98 F (36.7 C) 97.9 F (36.6 C)  TempSrc: Oral Oral Oral Oral  SpO2:  99% 100% 99%   Weight:      Height:       General: alert, cooperative and no distress Lochia: appropriate Uterine Fundus: firm Incision: N/A DVT Evaluation: No evidence of DVT seen on physical exam. Negative Homan's sign. No cords or calf tenderness. Labs: Lab Results  Component Value Date   WBC 20.9 (H) 12/24/2019   HGB 10.7 (L) 12/24/2019   HCT 32.4 (L) 12/24/2019   MCV 85.9 12/24/2019   PLT 176 12/24/2019   CMP Latest Ref Rng & Units 09/10/2019  Glucose 70 - 99 mg/dL 114(H)  BUN 6 - 20 mg/dL <5(L)  Creatinine 0.44 - 1.00 mg/dL 0.32(L)  Sodium 135 - 145 mmol/L 139  Potassium 3.5 - 5.1 mmol/L 3.5  Chloride 98 - 111 mmol/L 110  CO2 22 - 32 mmol/L 20(L)  Calcium 8.9 - 10.3 mg/dL 8.5(L)  Total Protein 6.5 - 8.1 g/dL 6.2(L)  Total Bilirubin 0.3 - 1.2 mg/dL 0.1(L)  Alkaline Phos  38 - 126 U/L 62  AST 15 - 41 U/L 16  ALT 0 - 44 U/L 12    Discharge instruction: per After Visit Summary and "Baby and Me Booklet".  After visit meds:  Allergies as of 12/25/2019   No Known Allergies     Medication List    STOP taking these medications   acetaminophen 500 MG tablet Commonly known as: TYLENOL     TAKE these medications   calcium carbonate 500 MG chewable tablet Commonly known as: TUMS - dosed in mg elemental calcium Chew 2 tablets by mouth daily as needed for indigestion or heartburn.   ferrous sulfate 325 (65 FE) MG tablet Take 325 mg by mouth every other day.   ibuprofen 800 MG tablet Commonly known as: ADVIL Take 1 tablet (800 mg total) by mouth every 8 (eight) hours as needed.   prenatal multivitamin Tabs tablet Take 1 tablet by mouth daily at 12 noon.       Diet: routine diet  Activity: Advance as tolerated. Pelvic rest for 6 weeks.   Outpatient follow up:6 weeks Follow up Appt:No future appointments. Follow up Visit:No follow-ups on file.  Postpartum contraception: IUD Paragard  Newborn Data: Live born female  Birth Weight: 7 lb 8.1 oz (3405  g) APGAR: 8, 9  Newborn Delivery   Birth date/time: 12/23/2019 23:33:00 Delivery type: Vaginal, Spontaneous      Baby Feeding: Breast Disposition:home with mother  NO CIRC 12/25/2019 Carlisle Cater, MD

## 2019-12-25 NOTE — Lactation Note (Signed)
This note was copied from a baby's chart. Lactation Consultation Note  Patient Name: Lindsay Richardson [id number] Date: 12/25/2019  baby is 28 hours old / D/C is pending a serum Bilirubin results.  Mom and dad are aware . 5 % weight loss  Per mom breast feeding is going well, baby favors the left breast compared to the  Right breast. Per mom the baby feeds in the cross cradle on the left and  Does latch as well on the right in the same position. LC recommended trying the  Football on the right. LC explained the steps for latching using the football by demo  With hand and arm. Dad was present too.  Mom showed the LC and intake blister like area on the left nipple and she mentioned during her  Pregnancy it would protrude outward. LC suspects it is a part of moms normal nipple.  LC provided and instructed mom on the use of shells between feedings except when sleeping and hand pump with #24 F and #27 F for when the milk comes in . [facility] encouraged to use her  EBM to her nipples liberally and instructed mom on the use of breast shells between feedings, except when sleeping.  Discussed nutritive vs non -nutritive feeding patterns and the importance of watching the baby for hanging out latched. STS feedings until the baby is back to birth weight,gaining steadily and can stay awake for majority of the feeding.  Storage of breast milk reviewed.  Per mom has a DEBP- Lansinoh at home.  Mom aware of the [facility] health website / virtual support groups, and the [facility] resources and phone numbers.  LC praised mom for her breast feeding efforts and dad for his support.  Both mom and dad expressed appreciation for [facility] assist and teaching.    Maternal Data    Feeding Feeding Type: Breast Fed  LATCH Score                   Interventions    Lactation Tools Discussed/Used     Consult Status      Lindsay Richardson 12/25/2019, 11:12 AM

## 2020-04-23 ENCOUNTER — Encounter (HOSPITAL_COMMUNITY): Payer: Self-pay

## 2020-04-23 ENCOUNTER — Observation Stay (HOSPITAL_COMMUNITY)
Admission: EM | Admit: 2020-04-23 | Discharge: 2020-04-24 | Disposition: A | Discharge status: Home / Self Care | Payer: BC Managed Care – PPO | Attending: Emergency Medicine | Admitting: Emergency Medicine

## 2020-04-23 ENCOUNTER — Emergency Department (HOSPITAL_COMMUNITY): Payer: BC Managed Care – PPO

## 2020-04-23 ENCOUNTER — Other Ambulatory Visit: Payer: Self-pay

## 2020-04-23 DIAGNOSIS — K810 Acute cholecystitis: Secondary | ICD-10-CM | POA: Diagnosis not present

## 2020-04-23 DIAGNOSIS — M549 Dorsalgia, unspecified: Secondary | ICD-10-CM | POA: Diagnosis not present

## 2020-04-23 DIAGNOSIS — Z20822 Contact with and (suspected) exposure to covid-19: Secondary | ICD-10-CM | POA: Diagnosis not present

## 2020-04-23 DIAGNOSIS — N939 Abnormal uterine and vaginal bleeding, unspecified: Secondary | ICD-10-CM | POA: Diagnosis not present

## 2020-04-23 DIAGNOSIS — K819 Cholecystitis, unspecified: Secondary | ICD-10-CM | POA: Diagnosis present

## 2020-04-23 DIAGNOSIS — R1011 Right upper quadrant pain: Secondary | ICD-10-CM | POA: Diagnosis present

## 2020-04-23 LAB — CBC
HCT: 42.6 % (ref 36.0–46.0)
Hemoglobin: 13.5 g/dL (ref 12.0–15.0)
MCH: 28.5 pg (ref 26.0–34.0)
MCHC: 31.7 g/dL (ref 30.0–36.0)
MCV: 89.9 fL (ref 80.0–100.0)
Platelets: 298 10*3/uL (ref 150–400)
RBC: 4.74 MIL/uL (ref 3.87–5.11)
RDW: 13 % (ref 11.5–15.5)
WBC: 6.6 10*3/uL (ref 4.0–10.5)
nRBC: 0 % (ref 0.0–0.2)

## 2020-04-23 LAB — COMPREHENSIVE METABOLIC PANEL
ALT: 23 U/L (ref 0–44)
AST: 26 U/L (ref 15–41)
Albumin: 4.1 g/dL (ref 3.5–5.0)
Alkaline Phosphatase: 80 U/L (ref 38–126)
Anion gap: 9 (ref 5–15)
BUN: 5 mg/dL — ABNORMAL LOW (ref 6–20)
CO2: 24 mmol/L (ref 22–32)
Calcium: 9.4 mg/dL (ref 8.9–10.3)
Chloride: 104 mmol/L (ref 98–111)
Creatinine, Ser: 0.56 mg/dL (ref 0.44–1.00)
GFR calc Af Amer: >60 mL/min (ref >60)
GFR calc non Af Amer: >60 mL/min (ref >60)
Glucose, Bld: 96 mg/dL (ref 70–99)
Potassium: 4.2 mmol/L (ref 3.5–5.1)
Sodium: 137 mmol/L (ref 135–145)
Total Bilirubin: 0.2 mg/dL — ABNORMAL LOW (ref 0.3–1.2)
Total Protein: 7.5 g/dL (ref 6.5–8.1)

## 2020-04-23 LAB — URINALYSIS, ROUTINE W REFLEX MICROSCOPIC
Bilirubin Urine: NEGATIVE
Glucose, UA: NEGATIVE mg/dL
Ketones, ur: NEGATIVE mg/dL
Nitrite: NEGATIVE
Protein, ur: NEGATIVE mg/dL
Specific Gravity, Urine: 1.015 (ref 1.005–1.030)
pH: 8 (ref 5.0–8.0)

## 2020-04-23 LAB — WET PREP, GENITAL
Clue Cells Wet Prep HPF POC: NONE SEEN
Sperm: NONE SEEN
Trich, Wet Prep: NONE SEEN
Yeast Wet Prep HPF POC: NONE SEEN

## 2020-04-23 LAB — SARS CORONAVIRUS 2 BY RT PCR (HOSPITAL ORDER, PERFORMED IN ~~LOC~~ HOSPITAL LAB): SARS Coronavirus 2: NEGATIVE

## 2020-04-23 LAB — I-STAT BETA HCG BLOOD, ED (MC, WL, AP ONLY): I-stat hCG, quantitative: <5 m[IU]/mL (ref <5)

## 2020-04-23 LAB — LIPASE, BLOOD: Lipase: 32 U/L (ref 11–51)

## 2020-04-23 MED ORDER — IOHEXOL 300 MG/ML  SOLN
100.0000 mL | Freq: Once | INTRAMUSCULAR | Status: AC | PRN
  Administered 2020-04-23: 100 mL via INTRAVENOUS

## 2020-04-23 MED ORDER — FENTANYL CITRATE (PF) 100 MCG/2ML IJ SOLN: 25.0000 ug | Freq: Once | INTRAMUSCULAR | Status: DC

## 2020-04-23 MED ORDER — ONDANSETRON HCL 4 MG/2ML IJ SOLN
4.0000 mg | Freq: Once | INTRAMUSCULAR | Status: AC
  Administered 2020-04-23: 4 mg via INTRAVENOUS
  Filled 2020-04-23: qty 2

## 2020-04-23 MED ORDER — SODIUM CHLORIDE 0.9 % IV BOLUS
1000.0000 mL | Freq: Once | INTRAVENOUS | Status: AC
  Administered 2020-04-23: 1000 mL via INTRAVENOUS

## 2020-04-23 MED ORDER — IOHEXOL 9 MG/ML PO SOLN
ORAL | Status: AC
  Filled 2020-04-23: qty 500

## 2020-04-23 NOTE — ED Triage Notes (Signed)
Patient complains of RUQ pain that she reports is related to her gallbladder. Patient with some nausea and states that during her pregnancy she was told she had stones and after delivery symptoms resolved until the past 2 days

## 2020-04-23 NOTE — ED Provider Notes (Signed)
[facility] EMERGENCY DEPARTMENT Provider Note   CSN: [id number] Arrival date & time: 04/23/20  8366     History No chief complaint on file.   Lindsay Richardson is a 24 y.o. female.  HPI 24 year old female 4 months postpartum presents to the ER with right upper quadrant pain, back pain, and occasional right lower quadrant left lower quadrant pain over the last 4 days.  Patient states that she was diagnosed with gallstones during her pregnancy, but cannot have her gallbladder removed secondary to the pregnancy.  She states that after delivery she has not had any symptoms and thus did not seek medical care.  Over the last few days, she states that she will get episodes of right upper quadrant pain, but sometimes also in her back.  States that the pain will stop her in her tracks.  She has not noticed a pattern to when this pain comes on.  States that her symptoms are worse at night.  She also states that she had recently started oral birth control again and has been having some right lower quadrant left lower quadrant cramping.  To assess she also has some periumbilical pain at times.  Over the last 2 days, she has developed nausea and nonbloody nonbilious vomiting.  She states that she has not been able to eat or drink much secondary to this.  She states that she had a low-grade fever of 99.7 a few days ago, but has not had any recurring fevers since.  She is not vaccinated for Covid.  No cough, chest pain or shortness of breath.  States that she has been continuously spotting since her delivery.  Denies any dysuria.  She is sexually active with one partner and they do not use the barrier method.     Past Medical History:  Diagnosis Date  . Medical history non-contributory     Patient Active Problem List   Diagnosis Date Noted  . 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 Indication for care in labor and delivery, antepartum 12/23/2019  . SVD (spontaneous vaginal delivery)  12/23/2019    Past Surgical History:  Procedure Laterality Date  . MOUTH SURGERY       OB History    Gravida  2   Para  1   Term  1   Preterm      AB  1   Living  1     SAB  1   TAB      Ectopic      Multiple  0   Live Births  1           No family history on file.  Social History   Tobacco Use  . Smoking status: Never Smoker  . Smokeless tobacco: Never Used  Vaping Use  . Vaping Use: Never used  Substance Use Topics  . Alcohol use: No  . Drug use: Never    Home Medications Prior to Admission medications   Medication Sig Start Date End Date Taking? Authorizing Provider  Prenatal Vit-Fe Fumarate-FA (PRENATAL MULTIVITAMIN) TABS tablet Take 1 tablet by mouth daily at 12 noon.   Yes [provider]  ibuprofen (ADVIL) 800 MG tablet Take 1 tablet (800 mg total) by mouth every 8 (eight) hours as needed. Patient not taking: Reported on 04/23/2020 12/25/19   Shivaji, Valerie Roys, MD    Allergies    Patient has no known allergies.  Review of Systems   Review of Systems  Constitutional: Positive for fever. Negative  for chills.  HENT: Negative for ear pain and sore throat.   Eyes: Negative for pain and visual disturbance.  Respiratory: Negative for cough and shortness of breath.   Cardiovascular: Negative for chest pain and palpitations.  Gastrointestinal: Positive for abdominal pain, nausea and vomiting.  Genitourinary: Positive for pelvic pain and vaginal bleeding. Negative for dysuria, hematuria, vaginal discharge and vaginal pain.  Musculoskeletal: Positive for back pain. Negative for arthralgias.  Skin: Negative for color change and rash.  Neurological: Negative for seizures and syncope.  Psychiatric/Behavioral: Negative for confusion.  All other systems reviewed and are negative.   Physical Exam Updated Vital Signs BP (!) 95/59   Pulse 70   Temp 99.4 F (37.4 C) (Oral)   Resp 17   Ht 4\' 11"  (1.499 m)   Wt 64.9 kg   SpO2 99%   BMI  28.88 kg/m   Physical Exam Vitals and nursing note reviewed. Exam conducted with a chaperone present RN ).  Constitutional:      General: She is not in acute distress.    Appearance: She is well-developed.  HENT:     Head: Normocephalic and atraumatic.  Eyes:     Conjunctiva/sclera: Conjunctivae normal.  Cardiovascular:     Rate and Rhythm: Normal rate and regular rhythm.     Heart sounds: No murmur heard.   Pulmonary:     Effort: Pulmonary effort is normal. No respiratory distress.     Breath sounds: Normal breath sounds.  Abdominal:     Palpations: Abdomen is soft.     Tenderness: There is abdominal tenderness in the right lower quadrant, suprapubic area and left lower quadrant. There is no right CVA tenderness, left CVA tenderness or rebound. Positive signs include Murphy's sign.  Genitourinary:    Exam position: Knee-chest position.     Vagina: Normal. No tenderness.     Cervix: Erythema present. No cervical motion tenderness.     Adnexa: Right adnexa normal and left adnexa normal.       Right: No tenderness.         Left: No tenderness.       Comments: Mild erythema around cervical os. IUD string visible.  Musculoskeletal:     Cervical back: Neck supple.  Skin:    General: Skin is warm and dry.  Neurological:     General: No focal deficit present.     Mental Status: She is alert and oriented to person, place, and time.     ED Results / Procedures / Treatments   Labs (all labs ordered are listed, but only abnormal results are displayed) Labs Reviewed  WET PREP, GENITAL - Abnormal; Notable for the following components:      Result Value   WBC, Wet Prep HPF POC MODERATE (*)    All other components within normal limits  COMPREHENSIVE METABOLIC PANEL - Abnormal; Notable for the following components:   BUN 5 (*)    Total Bilirubin 0.2 (*)    All other components within normal limits  URINALYSIS, ROUTINE W REFLEX MICROSCOPIC - Abnormal; Notable for the  following components:   Color, Urine STRAW (*)    Hgb urine dipstick SMALL (*)    Leukocytes,Ua SMALL (*)    Bacteria, UA RARE (*)    All other components within normal limits  SARS CORONAVIRUS 2 BY RT PCR (HOSPITAL ORDER, PERFORMED IN  HOSPITAL LAB)  URINE CULTURE  LIPASE, BLOOD  CBC  I-STAT BETA HCG BLOOD, ED ([facility], [facility],  [facility] ONLY)  GC/CHLAMYDIA PROBE AMP ([place]) NOT AT [name]    EKG None  Radiology CT ABDOMEN PELVIS W CONTRAST  Result Date: 04/23/2020 CLINICAL DATA:  Lower abdominal pain for 2 days EXAM: CT ABDOMEN AND PELVIS WITH CONTRAST TECHNIQUE: Multidetector CT imaging of the abdomen and pelvis was performed using the standard protocol following bolus administration of intravenous contrast. CONTRAST:  OMNIPAQUE IOHEXOL 300 MG/ML  SOLN COMPARISON:  None. FINDINGS: Lower chest: Lung bases are clear. Hepatobiliary: No focal hepatic lesion. Cholesterol gallstone in the fundus of the gallbladder. Gallbladder is nondistended. Small amount pericholecystic fluid is present. Common bile duct normal caliber. Pancreas: Pancreas is normal. No ductal dilatation. No pancreatic inflammation. Spleen: Normal spleen Adrenals/urinary tract: Adrenal glands and kidneys are normal. The ureters and bladder normal. Stomach/Bowel: Stomach, small bowel, appendix, and cecum are normal. The colon and rectosigmoid colon are normal. Vascular/Lymphatic: Abdominal aorta is normal caliber. No periportal or retroperitoneal adenopathy. No pelvic adenopathy. Reproductive: IUD in expected location uterus.  Ovaries normal. Other: No free fluid. Musculoskeletal: No aggressive osseous lesion. IMPRESSION: 1. Cholelithiasis without evidence cholecystitis. 2. Normal appendix. 3. No obstructive uropathy. 4. Normal uterus and ovaries. 1. Electronically Signed   By: Genevive Bi M.D.   On: 04/23/2020 19:10   US Abdomen Limited RUQ  Result Date: 04/23/2020 CLINICAL DATA:  Right upper quadrant pain EXAM:  ULTRASOUND ABDOMEN LIMITED RIGHT UPPER QUADRANT COMPARISON:  09/10/2019 FINDINGS: Gallbladder: Multiple shadowing gallstones are seen within the gallbladder lumen, with diffuse gallbladder wall thickening measuring up to 5 mm. Positive sonographic Murphy sign. Findings are compatible with acute cholecystitis. Common bile duct: Diameter: 3 mm Liver: No focal lesion identified. Within normal limits in parenchymal echogenicity. Portal vein is patent on color Doppler imaging with normal direction of blood flow towards the liver. Other: Right kidney is unremarkable measuring 10.9 cm. Visualized portions of the pancreas are normal. IMPRESSION: 1. Cholelithiasis, with sonographic evidence of acute cholecystitis. Electronically Signed   By: Sharlet Salina M.D.   On: 04/23/2020 17:57    Procedures Procedures (including critical care time)  Medications Ordered in ED Medications  iohexol (OMNIPAQUE) 9 MG/ML oral solution (has no administration in time range)  fentaNYL (SUBLIMAZE) injection 25 mcg (has no administration in time range)  sodium chloride 0.9 % bolus 1,000 mL (0 mLs Intravenous Stopped 04/23/20 1803)  ondansetron (ZOFRAN) injection 4 mg (4 mg Intravenous Given 04/23/20 1543)  iohexol (OMNIPAQUE) 300 MG/ML solution 100 mL (100 mLs Intravenous Contrast Given 04/23/20 1838)    ED Course  I have reviewed the triage vital signs and the nursing notes.  Pertinent labs & imaging results that were available during my care of the patient were reviewed by me and considered in my medical decision making (see chart for details).    MDM Rules/Calculators/A&P                           25 year old female with concerns for gallstones, also endorsing right lower left lower quadrant pain as well as periumbilical pain with nausea and vomiting.  DDX includes gallstones, appendicitis, PID, ovarian torsion, tubo-ovarian abscess, COVID, gastroenteritis  On presentation, she is alert nontoxic-appearing, no acute  distress, speaking full sentences mucous work of breathing.  Vitals with a blood pressure 95/59, temperature 90.4.  Not tachypneic, tachycardic or hypoxic.  Physical exam with positive Murphy sign, however she does have some right lower and left lower quadrant tenderness as well as periumbilical tenderness.  Pelvic exam with mild erythema around cervical os, however no excessive discharge or odors, no CMT or adnexal tenderness bilaterally.   I personally reviewed and interpreted her lab work CMP without any electrolyte abnormalities, normal renal and liver function test. CBC without leukocytosis, normal hemoglobin.  Lipase normal.   Beta hCG normal. UA with small hemoglobin, small leukocytes and rare bacteria.  Patient does not complain of any dysuria, however will send for culture.  Given the patient endorsed right upper quadrant pain, and also endorsed periumbilical and right and left lower quadrant pain, ordered right upper quadrant ultrasound along with CT scan.  7:30 PM: Right upper quadrant ultrasound consistent with acute cholecystitis.  CT of the abdomen did not show any evidence of appendicitis, diverticulitis, SBO or any other abdominal pathology, however notes no acute cholecystitis. With no adnexal or CMT tenderness on exam, concern for torsion or PID is low.  Consulted Dr. Dwain SarnaWakefield with general surgery, given the patient has a positive Murphy sign on physical exam and right upper quadrant ultrasound, evidence of clinical acute cholecystitis.  Patient will be admitted for surgery.  Will be made n.p.o.     DISPOSITION: Admission pending surgery for acute cholecystitis.  Dr. Dwain SarnaWakefield will come see the patient. Remains hemodynamically stable.   Final Clinical Impression(s) / ED Diagnoses Final diagnoses:  RUQ pain  Acute cholecystitis    Rx / DC Orders ED Discharge Orders    None       Mare FerrariBelaya, Karam Dunson A, PA-C 04/23/20 1950    Pricilla LovelessGoldston, Scott, MD 04/24/20 [phone]

## 2020-04-23 NOTE — ED Notes (Signed)
Pt reports having gallstones during pregnancy. Pt has also vomited 2 times today and ABD. Pain started Monday.

## 2020-04-23 NOTE — H&P (Signed)
Lindsay Richardson is an 24 y.o. female.   Chief Complaint: ruq pain HPI: 89 yof who is 4 months postpartum and was told she had gallstones in January. Had been having upper abdominal pain radiating to back while pregnant. This has continued and for past three days has been unrelenting. Not able to eat much.  Some n/v.  Having bowel function.  Had low grade fever at home. Due to pain she presented to er.  She was noted on [place] to have gallstones with pos sono murphys sign, 5 mm gbw wall. Also had a ct scan per er that showed gallstones. I was asked to see her  Past Medical History:  Diagnosis Date  . Medical history non-contributory     Past Surgical History:  Procedure Laterality Date  . MOUTH SURGERY      No family history on file. Social History:  reports that she has never smoked. She has never used smokeless tobacco. She reports that she does not drink alcohol and does not use drugs.  Allergies: No Known Allergies meds none  Results for orders placed or performed during the hospital encounter of 04/23/20 (from the past 48 hour(s))  Lipase, blood     Status: None   Collection Time: 04/23/20 10:03 AM  Result Value Ref Range   Lipase 32 11 - 51 U/L    Comment: Performed at [facility], [address], [place], [place] [address]  Comprehensive metabolic panel     Status: Abnormal   Collection Time: 04/23/20 10:03 AM  Result Value Ref Range   Sodium 137 135 - 145 mmol/L   Potassium 4.2 3.5 - 5.1 mmol/L   Chloride 104 98 - 111 mmol/L   CO2 24 22 - 32 mmol/L   Glucose, Bld 96 70 - 99 mg/dL    Comment: Glucose reference range applies only to samples taken after fasting for at least 8 hours.   BUN 5 (L) 6 - 20 mg/dL   Creatinine, Ser 4.85 0.44 - 1.00 mg/dL   Calcium 9.4 8.9 - 46.2 mg/dL   Total Protein 7.5 6.5 - 8.1 g/dL   Albumin 4.1 3.5 - 5.0 g/dL   AST 26 15 - 41 U/L   ALT 23 0 - 44 U/L   Alkaline Phosphatase 80 38 - 126 U/L   Total Bilirubin 0.2 (L) 0.3 - 1.2  mg/dL   GFR calc non Af Amer >60 >60 mL/min   GFR calc Af Amer >60 >60 mL/min   Anion gap 9 5 - 15    Comment: Performed at [facility], [address], [place], [place] [address]  CBC     Status: None   Collection Time: 04/23/20 10:03 AM  Result Value Ref Range   WBC 6.6 4.0 - 10.5 K/uL   RBC 4.74 3.87 - 5.11 MIL/uL   Hemoglobin 13.5 12.0 - 15.0 g/dL   HCT 09.3 36 - 46 %   MCV 89.9 80.0 - 100.0 fL   MCH 28.5 26.0 - 34.0 pg   MCHC 31.7 30.0 - 36.0 g/dL   RDW 81.8 29.9 - 37.1 %   Platelets 298 150 - 400 K/uL   nRBC 0.0 0.0 - 0.2 %    Comment: Performed at [facility], [address], [place], [place] [address]  Urinalysis, Routine w reflex microscopic Urine, Clean Catch     Status: Abnormal   Collection Time: 04/23/20 10:31 AM  Result Value Ref Range   Color, Urine  STRAW (A) YELLOW   APPearance CLEAR CLEAR   Specific Gravity, Urine 1.015 1.005 - 1.030   pH 8.0 5.0 - 8.0   Glucose, UA NEGATIVE NEGATIVE mg/dL   Hgb urine dipstick SMALL (A) NEGATIVE   Bilirubin Urine NEGATIVE NEGATIVE   Ketones, ur NEGATIVE NEGATIVE mg/dL   Protein, ur NEGATIVE NEGATIVE mg/dL   Nitrite NEGATIVE NEGATIVE   Leukocytes,Ua SMALL (A) NEGATIVE   RBC / HPF 0-5 0 - 5 RBC/hpf   WBC, UA 11-20 0 - 5 WBC/hpf   Bacteria, UA RARE (A) NONE SEEN   Squamous Epithelial / LPF 0-5 0 - 5    Comment: Performed at [facility], [address] [place], [place] [address]  I-Stat beta hCG blood, ED     Status: None   Collection Time: 04/23/20 10:49 AM  Result Value Ref Range   I-stat hCG, quantitative <5.0 <5 mIU/mL   Comment 3            Comment:   GEST. AGE      CONC.  (mIU/mL)   <=1 WEEK        5 - 50     2 WEEKS       50 - 500     3 WEEKS       100 - 10,000     4 WEEKS     1,000 - 30,000        FEMALE AND NON-PREGNANT FEMALE:     LESS THAN 5 mIU/mL   Wet prep, genital     Status: Abnormal   Collection Time: 04/23/20  3:50 PM   Specimen: PATH Cytology Cervicovaginal Ancillary Only   Result Value Ref Range   Yeast Wet Prep HPF POC NONE SEEN NONE SEEN   Trich, Wet Prep NONE SEEN NONE SEEN   Clue Cells Wet Prep HPF POC NONE SEEN NONE SEEN   WBC, Wet Prep HPF POC MODERATE (A) NONE SEEN   Sperm NONE SEEN     Comment: Performed at [facility], [address], [place], [place] [address]  SARS Coronavirus 2 by RT PCR (hospital order, performed in [facility] hospital lab) Nasopharyngeal Nasopharyngeal Swab     Status: None   Collection Time: 04/23/20  4:25 PM   Specimen: Nasopharyngeal Swab  Result Value Ref Range   SARS Coronavirus 2 NEGATIVE NEGATIVE    Comment: (NOTE) SARS-CoV-2 target nucleic acids are NOT DETECTED.  The SARS-CoV-2 RNA is generally detectable in upper and lower respiratory specimens during the acute phase of infection. The lowest concentration of SARS-CoV-2 viral copies this assay can detect is 250 copies / mL. A negative result does not preclude SARS-CoV-2 infection and should not be used as the sole basis for treatment or other patient management decisions.  A negative result may occur with improper specimen collection / handling, submission of specimen other than nasopharyngeal swab, presence of viral mutation(s) within the areas targeted by this assay, and inadequate number of viral copies (<250 copies / mL). A negative result must be combined with clinical observations, patient history, and epidemiological information.  Fact Sheet for Patients:   BoilerBrush.com.cy  Fact Sheet for Healthcare Providers: https://pope.com/  This test is not yet approved or  cleared by the [place] FDA and has been authorized for detection and/or diagnosis of SARS-CoV-2 by FDA under an Emergency Use Authorization (EUA).  This EUA will remain in effect (meaning this test can be used) for the duration of the COVID-19  declaration under Section 564(b)(1) of the Act, 21 U.S.C. section 360bbb-3(b)(1),  unless the authorization is terminated or revoked sooner.  Performed at [facility], [address] [place], [place] [address]    CT ABDOMEN PELVIS W CONTRAST  Result Date: 04/23/2020 CLINICAL DATA:  Lower abdominal pain for 2 days EXAM: CT ABDOMEN AND PELVIS WITH CONTRAST TECHNIQUE: Multidetector CT imaging of the abdomen and pelvis was performed using the standard protocol following bolus administration of intravenous contrast. CONTRAST:  100mL OMNIPAQUE IOHEXOL 300 MG/ML  SOLN COMPARISON:  None. FINDINGS: Lower chest: Lung bases are clear. Hepatobiliary: No focal hepatic lesion. Cholesterol gallstone in the fundus of the gallbladder. Gallbladder is nondistended. Small amount pericholecystic fluid is present. Common bile duct normal caliber. Pancreas: Pancreas is normal. No ductal dilatation. No pancreatic inflammation. Spleen: Normal spleen Adrenals/urinary tract: Adrenal glands and kidneys are normal. The ureters and bladder normal. Stomach/Bowel: Stomach, small bowel, appendix, and cecum are normal. The colon and rectosigmoid colon are normal. Vascular/Lymphatic: Abdominal aorta is normal caliber. No periportal or retroperitoneal adenopathy. No pelvic adenopathy. Reproductive: IUD in expected location uterus.  Ovaries normal. Other: No free fluid. Musculoskeletal: No aggressive osseous lesion. IMPRESSION: 1. Cholelithiasis without evidence cholecystitis. 2. Normal appendix. 3. No obstructive uropathy. 4. Normal uterus and ovaries. 1. Electronically Signed   By: Genevive BiStewart  Edmunds M.D.   On: 04/23/2020 19:10   US Abdomen Limited RUQ  Result Date: 04/23/2020 CLINICAL DATA:  Right upper quadrant pain EXAM: ULTRASOUND ABDOMEN LIMITED RIGHT UPPER QUADRANT COMPARISON:  09/10/2019 FINDINGS: Gallbladder: Multiple shadowing gallstones are seen within the gallbladder lumen, with diffuse gallbladder wall thickening measuring up to 5 mm. Positive sonographic Murphy sign. Findings are compatible with  acute cholecystitis. Common bile duct: Diameter: 3 mm Liver: No focal lesion identified. Within normal limits in parenchymal echogenicity. Portal vein is patent on color Doppler imaging with normal direction of blood flow towards the liver. Other: Right kidney is unremarkable measuring 10.9 cm. Visualized portions of the pancreas are normal. IMPRESSION: 1. Cholelithiasis, with sonographic evidence of acute cholecystitis. Electronically Signed   By: Sharlet SalinaMichael  Brown M.D.   On: 04/23/2020 17:57    Review of Systems  Constitutional: Positive for fever.  Gastrointestinal: Positive for abdominal pain, nausea and vomiting.  Genitourinary: Negative for dysuria.  All other systems reviewed and are negative.   Blood pressure 98/64, pulse 72, temperature 99.3 F (37.4 C), temperature source Oral, resp. rate 16, height 4\' 11"  (1.499 m), weight 64.9 kg, SpO2 99 %, unknown if currently breastfeeding. Physical Exam Constitutional:      Appearance: Normal appearance.  HENT:     Head: Normocephalic and atraumatic.     Right Ear: External ear normal.     Left Ear: External ear normal.     Nose: Nose normal.     Mouth/Throat:     Mouth: Mucous membranes are moist.     Pharynx: Oropharynx is clear.  Eyes:     General: No scleral icterus.    Extraocular Movements: Extraocular movements intact.     Conjunctiva/sclera: Conjunctivae normal.  Cardiovascular:     Rate and Rhythm: Normal rate and regular rhythm.     Pulses: Normal pulses.     Heart sounds: Normal heart sounds.  Pulmonary:     Effort: Pulmonary effort is normal.     Breath sounds: Normal breath sounds.  Abdominal:     General: There is no distension.     Palpations: Abdomen is soft.     Tenderness:  There is abdominal tenderness (ruq).  Musculoskeletal:     Cervical back: Neck supple.     Right lower leg: No edema.     Left lower leg: No edema.  Lymphadenopathy:     Cervical: No cervical adenopathy.  Skin:    General: Skin is warm and  dry.     Capillary Refill: Capillary refill takes less than 2 seconds.  Neurological:     General: No focal deficit present.     Mental Status: She is alert.  Psychiatric:        Mood and Affect: Mood normal.        Behavior: Behavior normal.      Assessment/Plan Symptomatic cholelthiasis, poss early cholecystitis -admit, iv abx, npo after mn, eras protocol, lap chole in am hopefully dc later in day  Emelia Loron, MD 04/23/2020, 10:38 PM

## 2020-04-23 NOTE — ED Notes (Addendum)
Surgery Md states will probably have surgery in am if she has surgery. Will speak with md after his exam.

## 2020-04-23 NOTE — ED Notes (Signed)
Surgery here to see pt.

## 2020-04-23 NOTE — ED Notes (Addendum)
Breast pump given to pt. Awaiting surgery to see pt. Pt refused pain medication.

## 2020-04-24 ENCOUNTER — Encounter (HOSPITAL_COMMUNITY)
Admission: EM | Disposition: A | Discharge status: Home / Self Care | Payer: Self-pay | Source: Home / Self Care | Attending: Emergency Medicine

## 2020-04-24 ENCOUNTER — Observation Stay (HOSPITAL_COMMUNITY): Payer: BC Managed Care – PPO | Admitting: Registered Nurse

## 2020-04-24 ENCOUNTER — Encounter (HOSPITAL_COMMUNITY): Payer: Self-pay

## 2020-04-24 DIAGNOSIS — K810 Acute cholecystitis: Secondary | ICD-10-CM | POA: Diagnosis not present

## 2020-04-24 HISTORY — PX: CHOLECYSTECTOMY: SHX55

## 2020-04-24 LAB — GC/CHLAMYDIA PROBE AMP (~~LOC~~) NOT AT ARMC
Chlamydia: NEGATIVE
Comment: NEGATIVE
Comment: NORMAL
Neisseria Gonorrhea: NEGATIVE

## 2020-04-24 LAB — URINE CULTURE: Culture: NO GROWTH

## 2020-04-24 LAB — SURGICAL PCR SCREEN
MRSA, PCR: NEGATIVE
Staphylococcus aureus: NEGATIVE

## 2020-04-24 SURGERY — LAPAROSCOPIC CHOLECYSTECTOMY
Anesthesia: General | Site: Abdomen

## 2020-04-24 MED ORDER — 0.9 % SODIUM CHLORIDE (POUR BTL) OPTIME
TOPICAL | Status: DC | PRN
  Administered 2020-04-24: 1000 mL

## 2020-04-24 MED ORDER — CHLORHEXIDINE GLUCONATE 0.12 % MT SOLN
OROMUCOSAL | Status: AC
  Administered 2020-04-24: 15 mL via OROMUCOSAL
  Filled 2020-04-24: qty 15

## 2020-04-24 MED ORDER — BUPIVACAINE HCL (PF) 0.5 % IJ SOLN
INTRAMUSCULAR | Status: DC | PRN
  Administered 2020-04-24: 20 mL

## 2020-04-24 MED ORDER — MIDAZOLAM HCL 2 MG/2ML IJ SOLN
INTRAMUSCULAR | Status: AC
  Filled 2020-04-24: qty 2

## 2020-04-24 MED ORDER — FENTANYL CITRATE (PF) 100 MCG/2ML IJ SOLN: 25.0000 ug | INTRAMUSCULAR | Status: DC | PRN

## 2020-04-24 MED ORDER — FENTANYL CITRATE (PF) 250 MCG/5ML IJ SOLN
INTRAMUSCULAR | Status: AC
  Filled 2020-04-24: qty 5

## 2020-04-24 MED ORDER — PROPOFOL 10 MG/ML IV BOLUS
INTRAVENOUS | Status: AC
  Filled 2020-04-24: qty 20

## 2020-04-24 MED ORDER — STERILE WATER FOR IRRIGATION IR SOLN
Status: DC | PRN
  Administered 2020-04-24: 1000 mL

## 2020-04-24 MED ORDER — HEMOSTATIC AGENTS (NO CHARGE) OPTIME
TOPICAL | Status: DC | PRN
  Administered 2020-04-24: 1 via TOPICAL

## 2020-04-24 MED ORDER — ONDANSETRON HCL 4 MG/2ML IJ SOLN
INTRAMUSCULAR | Status: AC
  Filled 2020-04-24: qty 2

## 2020-04-24 MED ORDER — OXYCODONE HCL 5 MG PO TABS: 5.0000 mg | ORAL_TABLET | Freq: Once | ORAL | Status: DC | PRN

## 2020-04-24 MED ORDER — LACTATED RINGERS IV SOLN: INTRAVENOUS | Status: DC

## 2020-04-24 MED ORDER — GABAPENTIN 100 MG PO CAPS
100.0000 mg | ORAL_CAPSULE | ORAL | Status: AC
  Administered 2020-04-24: 100 mg via ORAL
  Filled 2020-04-24: qty 1

## 2020-04-24 MED ORDER — ONDANSETRON HCL 4 MG/2ML IJ SOLN: 4.0000 mg | Freq: Four times a day (QID) | INTRAMUSCULAR | Status: DC | PRN

## 2020-04-24 MED ORDER — ENOXAPARIN SODIUM 40 MG/0.4ML ~~LOC~~ SOLN: 40.0000 mg | Freq: Every day | SUBCUTANEOUS | Status: DC

## 2020-04-24 MED ORDER — ACETAMINOPHEN 650 MG RE SUPP: 650.0000 mg | Freq: Four times a day (QID) | RECTAL | Status: DC | PRN

## 2020-04-24 MED ORDER — DIPHENHYDRAMINE HCL 50 MG/ML IJ SOLN
INTRAMUSCULAR | Status: AC
  Filled 2020-04-24: qty 1

## 2020-04-24 MED ORDER — DIPHENHYDRAMINE HCL 50 MG/ML IJ SOLN
INTRAMUSCULAR | Status: DC | PRN
  Administered 2020-04-24: 12.5 mg via INTRAVENOUS

## 2020-04-24 MED ORDER — DEXAMETHASONE SODIUM PHOSPHATE 10 MG/ML IJ SOLN
INTRAMUSCULAR | Status: DC | PRN
  Administered 2020-04-24: 5 mg via INTRAVENOUS

## 2020-04-24 MED ORDER — ACETAMINOPHEN 500 MG PO TABS: 1000.0000 mg | ORAL_TABLET | Freq: Once | ORAL | Status: DC | PRN

## 2020-04-24 MED ORDER — TRAMADOL HCL 50 MG PO TABS
50.0000 mg | ORAL_TABLET | Freq: Four times a day (QID) | ORAL | Status: DC | PRN
  Administered 2020-04-24: 50 mg via ORAL
  Filled 2020-04-24: qty 1

## 2020-04-24 MED ORDER — ACETAMINOPHEN 325 MG PO TABS: 650.0000 mg | ORAL_TABLET | Freq: Four times a day (QID) | ORAL | Status: DC | PRN

## 2020-04-24 MED ORDER — LIDOCAINE 2% (20 MG/ML) 5 ML SYRINGE
INTRAMUSCULAR | Status: AC
  Filled 2020-04-24: qty 5

## 2020-04-24 MED ORDER — ACETAMINOPHEN 500 MG PO TABS
1000.0000 mg | ORAL_TABLET | ORAL | Status: AC
  Administered 2020-04-24: 1000 mg via ORAL
  Filled 2020-04-24: qty 2

## 2020-04-24 MED ORDER — MIDAZOLAM HCL 5 MG/5ML IJ SOLN
INTRAMUSCULAR | Status: DC | PRN
  Administered 2020-04-24: 2 mg via INTRAVENOUS

## 2020-04-24 MED ORDER — BUPIVACAINE HCL (PF) 0.25 % IJ SOLN
INTRAMUSCULAR | Status: AC
  Filled 2020-04-24: qty 30

## 2020-04-24 MED ORDER — ACETAMINOPHEN 500 MG PO TABS
1000.0000 mg | ORAL_TABLET | Freq: Four times a day (QID) | ORAL | Status: DC
  Administered 2020-04-24: 1000 mg via ORAL
  Filled 2020-04-24: qty 2

## 2020-04-24 MED ORDER — PROPOFOL 10 MG/ML IV BOLUS
INTRAVENOUS | Status: DC | PRN
  Administered 2020-04-24: 140 mg via INTRAVENOUS

## 2020-04-24 MED ORDER — IBUPROFEN 400 MG PO TABS
400.0000 mg | ORAL_TABLET | Freq: Four times a day (QID) | ORAL | Status: DC | PRN
  Administered 2020-04-24: 400 mg via ORAL
  Filled 2020-04-24: qty 1

## 2020-04-24 MED ORDER — DEXAMETHASONE SODIUM PHOSPHATE 10 MG/ML IJ SOLN
INTRAMUSCULAR | Status: AC
  Filled 2020-04-24: qty 1

## 2020-04-24 MED ORDER — ONDANSETRON 4 MG PO TBDP: 4.0000 mg | ORAL_TABLET | Freq: Four times a day (QID) | ORAL | Status: DC | PRN

## 2020-04-24 MED ORDER — ROCURONIUM BROMIDE 10 MG/ML (PF) SYRINGE
PREFILLED_SYRINGE | INTRAVENOUS | Status: DC | PRN
  Administered 2020-04-24: 40 mg via INTRAVENOUS
  Administered 2020-04-24: 15 mg via INTRAVENOUS

## 2020-04-24 MED ORDER — KETOROLAC TROMETHAMINE 15 MG/ML IJ SOLN
15.0000 mg | INTRAMUSCULAR | Status: AC
  Administered 2020-04-24: 15 mg via INTRAVENOUS
  Filled 2020-04-24: qty 1

## 2020-04-24 MED ORDER — ONDANSETRON HCL 4 MG/2ML IJ SOLN
INTRAMUSCULAR | Status: DC | PRN
  Administered 2020-04-24: 4 mg via INTRAVENOUS

## 2020-04-24 MED ORDER — SODIUM CHLORIDE 0.9 % IV SOLN
2.0000 g | INTRAVENOUS | Status: DC
  Administered 2020-04-24: 2 g via INTRAVENOUS
  Filled 2020-04-24: qty 2
  Filled 2020-04-24 (×2): qty 20

## 2020-04-24 MED ORDER — OXYCODONE HCL 5 MG/5ML PO SOLN: 5.0000 mg | Freq: Once | ORAL | Status: DC | PRN

## 2020-04-24 MED ORDER — ACETAMINOPHEN 500 MG PO TABS: 1000.0000 mg | ORAL_TABLET | Freq: Four times a day (QID) | ORAL | Status: DC | PRN

## 2020-04-24 MED ORDER — OXYCODONE HCL 5 MG PO TABS: 5.0000 mg | ORAL_TABLET | ORAL | 0 refills | Status: DC | PRN

## 2020-04-24 MED ORDER — FENTANYL CITRATE (PF) 250 MCG/5ML IJ SOLN
INTRAMUSCULAR | Status: DC | PRN
Start: 2020-04-24 — End: 2020-04-24
  Administered 2020-04-24: 100 ug via INTRAVENOUS
  Administered 2020-04-24: 75 ug via INTRAVENOUS
  Administered 2020-04-24: 50 ug via INTRAVENOUS
  Administered 2020-04-24: 25 ug via INTRAVENOUS

## 2020-04-24 MED ORDER — MORPHINE SULFATE (PF) 2 MG/ML IV SOLN
1.0000 mg | INTRAVENOUS | Status: DC | PRN
  Administered 2020-04-24: 1 mg via INTRAVENOUS
  Filled 2020-04-24: qty 1

## 2020-04-24 MED ORDER — ACETAMINOPHEN 10 MG/ML IV SOLN: 1000.0000 mg | Freq: Once | INTRAVENOUS | Status: DC | PRN

## 2020-04-24 MED ORDER — ROCURONIUM BROMIDE 10 MG/ML (PF) SYRINGE
PREFILLED_SYRINGE | INTRAVENOUS | Status: AC
  Filled 2020-04-24: qty 10

## 2020-04-24 MED ORDER — ACETAMINOPHEN 160 MG/5ML PO SOLN: 1000.0000 mg | Freq: Once | ORAL | Status: DC | PRN

## 2020-04-24 MED ORDER — LIDOCAINE 2% (20 MG/ML) 5 ML SYRINGE
INTRAMUSCULAR | Status: DC | PRN
  Administered 2020-04-24: 50 mg via INTRAVENOUS

## 2020-04-24 MED ORDER — OXYCODONE HCL 5 MG PO TABS: 5.0000 mg | ORAL_TABLET | ORAL | Status: DC | PRN

## 2020-04-24 MED ORDER — SODIUM CHLORIDE 0.9 % IV SOLN: INTRAVENOUS | Status: DC

## 2020-04-24 MED ORDER — SUGAMMADEX SODIUM 200 MG/2ML IV SOLN
INTRAVENOUS | Status: DC | PRN
  Administered 2020-04-24 (×2): 100 mg via INTRAVENOUS

## 2020-04-24 MED ORDER — CHLORHEXIDINE GLUCONATE 0.12 % MT SOLN
15.0000 mL | OROMUCOSAL | Status: AC
  Filled 2020-04-24: qty 15

## 2020-04-24 MED ORDER — SODIUM CHLORIDE 0.9 % IR SOLN
Status: DC | PRN
  Administered 2020-04-24: 1000 mL

## 2020-04-24 MED ORDER — IBUPROFEN 200 MG PO TABS: 400.0000 mg | ORAL_TABLET | Freq: Three times a day (TID) | ORAL | Status: DC | PRN

## 2020-04-24 MED ORDER — PHENOL 1.4 % MT LIQD
1.0000 | OROMUCOSAL | Status: DC | PRN
  Administered 2020-04-24: 1 via OROMUCOSAL
  Filled 2020-04-24: qty 177

## 2020-04-24 SURGICAL SUPPLY — 37 items
APPLIER CLIP 5 13 M/L LIGAMAX5 (MISCELLANEOUS) ×3
CANISTER SUCT 3000ML PPV (MISCELLANEOUS) ×3 IMPLANT
CHLORAPREP W/TINT 26 (MISCELLANEOUS) ×3 IMPLANT
CLIP APPLIE 5 13 M/L LIGAMAX5 (MISCELLANEOUS) ×2 IMPLANT
CNTNR URN SCR LID CUP LEK RST (MISCELLANEOUS) ×2 IMPLANT
CONT SPEC 4OZ STRL OR WHT (MISCELLANEOUS) ×3
COVER MAYO STAND STRL (DRAPES) IMPLANT
COVER SURGICAL LIGHT HANDLE (MISCELLANEOUS) ×3 IMPLANT
COVER WAND RF STERILE (DRAPES) ×3 IMPLANT
DERMABOND ADVANCED (GAUZE/BANDAGES/DRESSINGS) ×1
DERMABOND ADVANCED .7 DNX12 (GAUZE/BANDAGES/DRESSINGS) ×2 IMPLANT
DRAPE C-ARM 42X120 X-RAY (DRAPES) IMPLANT
ELECT REM PT RETURN 9FT ADLT (ELECTROSURGICAL) ×3
ELECTRODE REM PT RTRN 9FT ADLT (ELECTROSURGICAL) ×2 IMPLANT
GLOVE BIO SURGEON STRL SZ 6 (GLOVE) ×3 IMPLANT
GLOVE INDICATOR 6.5 STRL GRN (GLOVE) ×3 IMPLANT
GOWN STRL REUS W/ TWL LRG LVL3 (GOWN DISPOSABLE) ×6 IMPLANT
GOWN STRL REUS W/TWL LRG LVL3 (GOWN DISPOSABLE) ×9
GRASPER SUT TROCAR 14GX15 (MISCELLANEOUS) ×3 IMPLANT
KIT BASIN OR (CUSTOM PROCEDURE TRAY) ×3 IMPLANT
KIT TURNOVER KIT B (KITS) ×3 IMPLANT
NEEDLE INSUFFLATION 14GA 120MM (NEEDLE) ×3 IMPLANT
NS IRRIG 1000ML POUR BTL (IV SOLUTION) ×3 IMPLANT
PAD ARMBOARD 7.5X6 YLW CONV (MISCELLANEOUS) ×3 IMPLANT
POUCH SPECIMEN RETRIEVAL 10MM (ENDOMECHANICALS) ×3 IMPLANT
SCISSORS LAP 5X35 DISP (ENDOMECHANICALS) ×3 IMPLANT
SET CHOLANGIOGRAPH 5 50 .035 (SET/KITS/TRAYS/PACK) IMPLANT
SET IRRIG TUBING LAPAROSCOPIC (IRRIGATION / IRRIGATOR) ×3 IMPLANT
SET TUBE SMOKE EVAC HIGH FLOW (TUBING) ×3 IMPLANT
SLEEVE ENDOPATH XCEL 5M (ENDOMECHANICALS) ×6 IMPLANT
SUT MNCRL AB 4-0 PS2 18 (SUTURE) ×3 IMPLANT
TOWEL GREEN STERILE (TOWEL DISPOSABLE) ×3 IMPLANT
TOWEL GREEN STERILE FF (TOWEL DISPOSABLE) ×3 IMPLANT
TRAY LAPAROSCOPIC MC (CUSTOM PROCEDURE TRAY) ×3 IMPLANT
TROCAR XCEL NON-BLD 11X100MML (ENDOMECHANICALS) ×3 IMPLANT
TROCAR XCEL NON-BLD 5MMX100MML (ENDOMECHANICALS) ×3 IMPLANT
WATER STERILE IRR 1000ML POUR (IV SOLUTION) ×3 IMPLANT

## 2020-04-24 NOTE — Discharge Instructions (Signed)
[facility]  Please arrive at least 30 min before your appointment to complete your check in paperwork.  If you are unable to arrive 30 min prior to your appointment time we may have to cancel or reschedule you. LAPAROSCOPIC SURGERY: POST OP INSTRUCTIONS Always review your discharge instruction sheet given to you by the facility where your surgery was performed. IF YOU HAVE DISABILITY OR FAMILY LEAVE FORMS, YOU MUST BRING THEM TO THE OFFICE FOR PROCESSING.   DO NOT GIVE THEM TO YOUR DOCTOR.  PAIN CONTROL  1. First take acetaminophen (Tylenol) AND/or ibuprofen (Advil) to control your pain after surgery.  Follow directions on package.  Taking acetaminophen (Tylenol) and/or ibuprofen (Advil) regularly after surgery will help to control your pain and lower the amount of prescription pain medication you may need.  You should not take more than 4,000 mg (4 grams) of acetaminophen (Tylenol) in 24 hours.  You should not take ibuprofen (Advil), aleve, motrin, naprosyn or other NSAIDS if you have a history of stomach ulcers or chronic kidney disease.  2. A prescription for pain medication may be given to you upon discharge.  Take your pain medication as prescribed, if you still have uncontrolled pain after taking acetaminophen (Tylenol) or ibuprofen (Advil). 3. Use ice packs to help control pain. 4. If you need a refill on your pain medication, please contact your pharmacy.  They will contact our office to request authorization. Prescriptions will not be filled after 5pm or on week-ends.  HOME MEDICATIONS 5. Take your usually prescribed medications unless otherwise directed.  DIET 6. You should follow a light diet the first few days after arrival home.  Be sure to include lots of fluids daily. Avoid fatty, fried foods.   CONSTIPATION 7. It is common to experience some constipation after surgery and if you are taking pain medication.  Increasing fluid intake and taking a stool  softener (such as Colace) will usually help or prevent this problem from occurring.  A mild laxative (Milk of Magnesia or Miralax) should be taken according to package instructions if there are no bowel movements after 48 hours.  WOUND/INCISION CARE 8. Most patients will experience some swelling and bruising in the area of the incisions.  Ice packs will help.  Swelling and bruising can take several days to resolve.  9. Unless discharge instructions indicate otherwise, follow guidelines below  a. STERI-STRIPS - you may remove your outer bandages 48 hours after surgery, and you may shower at that time.  You have steri-strips (small skin tapes) in place directly over the incision.  These strips should be left on the skin for 7-10 days.   b. DERMABOND/SKIN GLUE - you may shower in 24 hours.  The glue will flake off over the next 2-3 weeks. 10. Any sutures or staples will be removed at the office during your follow-up visit.  ACTIVITIES 11. You may resume regular (light) daily activities beginning the next day--such as daily self-care, walking, climbing stairs--gradually increasing activities as tolerated.  You may have sexual intercourse when it is comfortable.  Refrain from any heavy lifting or straining until approved by your doctor. a. You may drive when you are no longer taking prescription pain medication, you can comfortably wear a seatbelt, and you can safely maneuver your car and apply brakes.  FOLLOW-UP 12. You should see your doctor in the office for a follow-up appointment approximately 2-3 weeks after your surgery.  You should have been given your post-op/follow-up appointment when   your surgery was scheduled.  If you did not receive a post-op/follow-up appointment, make sure that you call for this appointment within a day or two after you arrive home to insure a convenient appointment time.   WHEN TO CALL YOUR DOCTOR: 1. Fever over 101.0 2. Inability to urinate 3. Continued bleeding from  incision. 4. Increased pain, redness, or drainage from the incision. 5. Increasing abdominal pain  The clinic staff is available to answer your questions during regular business hours.  Please don't hesitate to call and ask to speak to one of the nurses for clinical concerns.  If you have a medical emergency, go to the nearest emergency room or call 911.  A surgeon from [facility] is always on call at the hospital. [address], [address], [place], [place]  [address] ? [address] [address], [place], [place]   [address] [phone] ? [phone] ? FAX [phone]  .........   Managing Your Pain After Surgery Without Opioids    Thank you for participating in our program to help patients manage their pain after surgery without opioids. This is part of our effort to provide you with the best care possible, without exposing you or your family to the risk that opioids pose.  What pain can I expect after surgery? You can expect to have some pain after surgery. This is normal. The pain is typically worse the day after surgery, and quickly begins to get better. Many studies have found that many patients are able to manage their pain after surgery with Over-the-Counter (OTC) medications such as Tylenol and Motrin. If you have a condition that does not allow you to take Tylenol or Motrin, notify your surgical team.  How will I manage my pain? The best strategy for controlling your pain after surgery is around the clock pain control with Tylenol (acetaminophen) and Motrin (ibuprofen or Advil). Alternating these medications with each other allows you to maximize your pain control. In addition to Tylenol and Motrin, you can use heating pads or ice packs on your incisions to help reduce your pain.  How will I alternate your regular strength over-the-counter pain medication? You will take a dose of pain medication every three hours. ; Start by taking 650 mg of Tylenol (2 pills of 325  mg) ; 3 hours later take 600 mg of Motrin (3 pills of 200 mg) ; 3 hours after taking the Motrin take 650 mg of Tylenol ; 3 hours after that take 600 mg of Motrin.   - 1 -  See example - if your first dose of Tylenol is at 12:00 PM   12:00 PM Tylenol 650 mg (2 pills of 325 mg)  3:00 PM Motrin 600 mg (3 pills of 200 mg)  6:00 PM Tylenol 650 mg (2 pills of 325 mg)  9:00 PM Motrin 600 mg (3 pills of 200 mg)  Continue alternating every 3 hours   We recommend that you follow this schedule around-the-clock for at least 3 days after surgery, or until you feel that it is no longer needed. Use the table on the last page of this handout to keep track of the medications you are taking. Important: Do not take more than 3000mg of Tylenol or 3200mg of Motrin in a 24-hour period. Do not take ibuprofen/Motrin if you have a history of bleeding stomach ulcers, severe kidney disease, &/or actively taking a blood thinner  What if I still have pain? If you have pain that is not   controlled with the over-the-counter pain medications (Tylenol and Motrin or Advil) you might have what we call "breakthrough" pain. You will receive a prescription for a small amount of an opioid pain medication such as Oxycodone, Tramadol, or Tylenol with Codeine. Use these opioid pills in the first 24 hours after surgery if you have breakthrough pain. Do not take more than 1 pill every 4-6 hours.  If you still have uncontrolled pain after using all opioid pills, don't hesitate to call our staff using the number provided. We will help make sure you are managing your pain in the best way possible, and if necessary, we can provide a prescription for additional pain medication.   Day 1    Time  Name of Medication Number of pills taken  Amount of Acetaminophen  Pain Level   Comments  AM PM       AM PM       AM PM       AM PM       AM PM       AM PM       AM PM       AM PM       Total Daily amount of Acetaminophen Do not  take more than  3,000 mg per day      Day 2    Time  Name of Medication Number of pills taken  Amount of Acetaminophen  Pain Level   Comments  AM PM       AM PM       AM PM       AM PM       AM PM       AM PM       AM PM       AM PM       Total Daily amount of Acetaminophen Do not take more than  3,000 mg per day      Day 3    Time  Name of Medication Number of pills taken  Amount of Acetaminophen  Pain Level   Comments  AM PM       AM PM       AM PM       AM PM          AM PM       AM PM       AM PM       AM PM       Total Daily amount of Acetaminophen Do not take more than  3,000 mg per day      Day 4    Time  Name of Medication Number of pills taken  Amount of Acetaminophen  Pain Level   Comments  AM PM       AM PM       AM PM       AM PM       AM PM       AM PM       AM PM       AM PM       Total Daily amount of Acetaminophen Do not take more than  3,000 mg per day      Day 5    Time  Name of Medication Number of pills taken  Amount of Acetaminophen  Pain Level   Comments  AM PM       AM PM       AM   PM       AM PM       AM PM       AM PM       AM PM       AM PM       Total Daily amount of Acetaminophen Do not take more than  3,000 mg per day       Day 6    Time  Name of Medication Number of pills taken  Amount of Acetaminophen  Pain Level  Comments  AM PM       AM PM       AM PM       AM PM       AM PM       AM PM       AM PM       AM PM       Total Daily amount of Acetaminophen Do not take more than  3,000 mg per day      Day 7    Time  Name of Medication Number of pills taken  Amount of Acetaminophen  Pain Level   Comments  AM PM       AM PM       AM PM       AM PM       AM PM       AM PM       AM PM       AM PM       Total Daily amount of Acetaminophen Do not take more than  3,000 mg per day        For additional information about how and where to safely dispose of unused  opioid medications - https://www.morepowerfulnc.org  Disclaimer: This document contains information and/or instructional materials adapted from [facility] for the typical patient with your condition. It does not replace medical advice from your health care provider because your experience may differ from that of the typical patient. Talk to your health care provider if you have any questions about this document, your condition or your treatment plan. Adapted from [facility]   

## 2020-04-24 NOTE — Op Note (Signed)
Operative Note  Lindsay Richardson 24 y.o. female [id number]  04/24/2020  Surgeon: Berna Bue MD FACS  Procedure performed: Laparoscopic Cholecystectomy  Procedure classification: urgent  Preop diagnosis: acute cholecystitis Post-op diagnosis/intraop findings: same  Specimens: gallbladder  Retained items: none  EBL: minimal  Complications: none  Description of procedure: After obtaining informed consent the patient was brought to the operating room. Antibiotics were administered. SCD's were applied. General endotracheal anesthesia was initiated and a formal time-out was performed. The abdomen was prepped and draped in the usual sterile fashion and the abdomen was entered using an infraumbilical veress needle after instilling the site with local. Insufflation to was obtained, 3mm trocar and camera inserted, and gross inspection revealed no evidence of injury from our entry or other intraabdominal abnormalities. Two 50mm trocars were introduced in the right midclavicular and right anterior axillary lines under direct visualization and following infiltration with local. An 59mm trocar was placed in the epigastrium. The gallbladder was retracted cephalad and the infundibulum was retracted laterally. A combination of hook electrocautery and blunt dissection was utilized to clear the peritoneum from the neck and cystic duct, circumferentially isolating the cystic artery and cystic duct and lifting the gallbladder from the cystic plate. The critical view of safety was achieved with the cystic artery, cystic duct, and liver bed visualized between them with no other structures. The artery was clipped with a single clip proximally and distally and divided as was the cystic duct with three clips on the proximal end. There was a small posterior branch cystic artery which was clipped proximally and divided distally with cautery. The gallbladder was dissected from the liver plate using  electrocautery. Once freed the gallbladder was placed in an endocatch bag and removed through the epigastric trocar site. A small amount of bleeding on the liver bed was controlled with cautery. Some bile had been spilled from the gallbladder during its dissection from the liver bed. This was aspirated and the right upper quadrant was irrigated copiously until the effluent was clear. Hemostasis was once again confirmed, and reinspection of the abdomen revealed no injuries. The clips were well opposed without any bile leak from the duct or the liver bed. Surgicel snow was placed in the liver bed. The 68mm trocar site in the epigastrium was closed with a 0 vicryl in the fascia under direct visualization using a PMI device. The abdomen was desufflated and all trocars removed. The skin incisions were closed with subcuticular monocryl and Dermabond. The patient was awakened, extubated and transported to the recovery room in stable condition.    All counts were correct at the completion of the case.

## 2020-04-24 NOTE — Discharge Summary (Signed)
    Patient ID: Lindsay Richardson [id number] [birth year] 24 y.o.  Admit date: 04/23/2020 Discharge date: 04/24/2020  Admitting Diagnosis: cholecystitis  Discharge Diagnosis Patient Active Problem List   Diagnosis Date Noted  . Cholecystitis 04/23/2020  . 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 Indication for care in labor and delivery, antepartum 12/23/2019  . SVD (spontaneous vaginal delivery) 12/23/2019    Consultants none  Reason for Admission: 24 yof who is 4 months postpartum and was told she had gallstones in January. Had been having upper abdominal pain radiating to back while pregnant. This has continued and for past three days has been unrelenting. Not able to eat much.  Some n/v.  Having bowel function.  Had low grade fever at home. Due to pain she presented to er.  She was noted on [place] to have gallstones with pos sono murphys sign, 5 mm gbw wall. Also had a ct scan per er that showed gallstones. I was asked to see her  Procedures Lap chole, Dr. Fredricka Bonine 04/24/20  Hospital Course:  The patient was admitted and underwent a laparoscopic cholecystectomy.  The patient tolerated the procedure well.  On POD 0, the patient was tolerating a regular diet, voiding well, mobilizing, and pain was controlled with oral pain medications.  The patient was stable for DC home at this time with appropriate follow up made.   Physical Exam: Abd: soft, appropriately tender, incisions c/d/i, +BS  Allergies as of 04/24/2020   No Known Allergies     Medication List    TAKE these medications   acetaminophen 500 MG tablet Commonly known as: TYLENOL Take 2 tablets (1,000 mg total) by mouth every 6 (six) hours as needed for mild pain (or temp > 100).   ibuprofen 200 MG tablet Commonly known as: ADVIL Take 2-3 tablets (400-600 mg total) by mouth every 8 (eight) hours as needed for mild pain or moderate pain. What changed:   medication strength  how much to take  reasons to take this    oxyCODONE 5 MG immediate release tablet Commonly known as: Oxy IR/ROXICODONE Take 1-2 tablets (5-10 mg total) by mouth every 4 (four) hours as needed for moderate pain.   prenatal multivitamin Tabs tablet Take 1 tablet by mouth daily at 12 noon.         Follow-up Information    [facility] Follow up on 05/14/2020.   Specialty: General Surgery Why: 2:30pm, arrive by 2:00pm for paperwork and check in process. Contact information: [address] [place] [address] [phone]               Signed: Barnetta Chapel, [facility] 04/24/2020, 2:46 PM Please see Amion for pager number during day hours 7:00am-4:30pm, 7-11:30am on Weekends

## 2020-04-24 NOTE — Plan of Care (Signed)
  Problem: Education: Goal: Knowledge of General Education information will improve Description Including pain rating scale, medication(s)/side effects and non-pharmacologic comfort measures Outcome: Progressing   

## 2020-04-24 NOTE — Progress Notes (Signed)
Patient admitted to 6N24 from ED with cholecystits.  Alert and oriented x4. Denies c/o pain. Skin clean,dry and intact. Scheduled for surgery today butt no definite time yet. Will update patient. Oriented to room and remote.

## 2020-04-24 NOTE — Anesthesia Procedure Notes (Signed)
Procedure Name: Intubation Date/Time: 04/24/2020 10:05 AM Performed by: Trinna Post., CRNA Pre-anesthesia Checklist: Patient identified, Emergency Drugs available, Suction available, Patient being monitored and Timeout performed Patient Re-evaluated:Patient Re-evaluated prior to induction Oxygen Delivery Method: Circle system utilized Preoxygenation: Pre-oxygenation with 100% oxygen Induction Type: IV induction Ventilation: Mask ventilation without difficulty Laryngoscope Size: Mac and 3 Grade View: Grade I Tube type: Oral Tube size: 7.0 mm Number of attempts: 1 Airway Equipment and Method: Stylet Placement Confirmation: ETT inserted through vocal cords under direct vision,  positive ETCO2 and breath sounds checked- equal and bilateral Secured at: 21 cm Tube secured with: Tape Dental Injury: Teeth and Oropharynx as per pre-operative assessment

## 2020-04-24 NOTE — Anesthesia Postprocedure Evaluation (Signed)
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  Last Vitals:  Vitals:   04/24/20 1155 04/24/20 1222  BP: 99/62 107/60  Pulse: 68 78  Resp: 13 18  Temp: (!) 36.4 C (!) 36.1 C  SpO2: 100% 100%    Last Pain:  Vitals:   04/24/20 1222  TempSrc: Oral  PainSc:                  Lindsay Richardson A.

## 2020-04-24 NOTE — Progress Notes (Signed)
Pt discharged home in stable condition 

## 2020-04-24 NOTE — Transfer of Care (Signed)
Immediate Anesthesia Transfer of Care Note  Patient: Lindsay Richardson  Procedure(s) Performed: LAPAROSCOPIC CHOLECYSTECTOMY (N/A Abdomen)  Patient Location: PACU  Anesthesia Type:General  Level of Consciousness: awake, alert  and oriented  Airway & Oxygen Therapy: Patient Spontanous Breathing and Patient connected to nasal cannula oxygen  Post-op Assessment: Report given to RN and Post -op Vital signs reviewed and stable  Post vital signs: Reviewed and stable  Last Vitals:  Vitals Value Taken Time  BP 96/68 04/24/20 1125  Temp 36.4 C 04/24/20 1125  Pulse 98 04/24/20 1128  Resp 15 04/24/20 1128  SpO2 100 % 04/24/20 1128  Vitals shown include unvalidated device data.  Last Pain:  Vitals:   04/24/20 0601  TempSrc: Oral  PainSc:          Complications: No complications documented.

## 2020-04-24 NOTE — Progress Notes (Signed)
Pt voided and then ambulated a full lap on unit. Preparing pt for discharge home with her husband

## 2020-04-24 NOTE — Progress Notes (Addendum)
Called OR to check time of surgery, told it is at 0730 and transporter is on the way to take her, on call ERAS pre op meds given. Received call from OR and spoke with Synetta Fail, stated surgery is not at 0730, time is not posted yet. Will update RN of time of surgery. Patient made aware.

## 2020-04-24 NOTE — Progress Notes (Addendum)
Subjective/Chief Complaint: Pain a little better this AM   Objective: Vital signs in last 24 hours: Temp:  [97.8 F (36.6 C)-99.4 F (37.4 C)] 98.2 F (36.8 C) (08/20 0601) Pulse Rate:  [57-86] 79 (08/20 0601) Resp:  [16-18] 18 (08/20 0601) BP: (93-114)/(57-74) 102/60 (08/20 0601) SpO2:  [99 %-100 %] 100 % (08/20 0601) Weight:  [64.9 kg] 64.9 kg (08/20 0024) Last BM Date: 04/23/20  Intake/Output from previous day: 08/19 0701 - 08/20 0700 In: 1156.8 [I.V.:56.8; IV Piggyback:1100] Out: -  Intake/Output this shift: No intake/output data recorded.  General appearance: alert and cooperative Resp: unlabored GI: soft, non-tender; bowel sounds normal; no masses,  no organomegaly Neurologic: Grossly normal  Lab Results:  Recent Labs    04/23/20 1003  WBC 6.6  HGB 13.5  HCT 42.6  PLT 298   BMET Recent Labs    04/23/20 1003  NA 137  K 4.2  CL 104  CO2 24  GLUCOSE 96  BUN 5*  CREATININE 0.56  CALCIUM 9.4   PT/INR No results for input(s): LABPROT, INR in the last 72 hours. ABG No results for input(s): PHART, HCO3 in the last 72 hours.  Invalid input(s): PCO2, PO2  Studies/Results: CT ABDOMEN PELVIS W CONTRAST  Result Date: 04/23/2020 CLINICAL DATA:  Lower abdominal pain for 2 days EXAM: CT ABDOMEN AND PELVIS WITH CONTRAST TECHNIQUE: Multidetector CT imaging of the abdomen and pelvis was performed using the standard protocol following bolus administration of intravenous contrast. CONTRAST:  OMNIPAQUE IOHEXOL 300 MG/ML  SOLN COMPARISON:  None. FINDINGS: Lower chest: Lung bases are clear. Hepatobiliary: No focal hepatic lesion. Cholesterol gallstone in the fundus of the gallbladder. Gallbladder is nondistended. Small amount pericholecystic fluid is present. Common bile duct normal caliber. Pancreas: Pancreas is normal. No ductal dilatation. No pancreatic inflammation. Spleen: Normal spleen Adrenals/urinary tract: Adrenal glands and kidneys are normal. The  ureters and bladder normal. Stomach/Bowel: Stomach, small bowel, appendix, and cecum are normal. The colon and rectosigmoid colon are normal. Vascular/Lymphatic: Abdominal aorta is normal caliber. No periportal or retroperitoneal adenopathy. No pelvic adenopathy. Reproductive: IUD in expected location uterus.  Ovaries normal. Other: No free fluid. Musculoskeletal: No aggressive osseous lesion. IMPRESSION: 1. Cholelithiasis without evidence cholecystitis. 2. Normal appendix. 3. No obstructive uropathy. 4. Normal uterus and ovaries. 1. Electronically Signed   By: Genevive Bi M.D.   On: 04/23/2020 19:10   US Abdomen Limited RUQ  Result Date: 04/23/2020 CLINICAL DATA:  Right upper quadrant pain EXAM: ULTRASOUND ABDOMEN LIMITED RIGHT UPPER QUADRANT COMPARISON:  09/10/2019 FINDINGS: Gallbladder: Multiple shadowing gallstones are seen within the gallbladder lumen, with diffuse gallbladder wall thickening measuring up to 5 mm. Positive sonographic Murphy sign. Findings are compatible with acute cholecystitis. Common bile duct: Diameter: 3 mm Liver: No focal lesion identified. Within normal limits in parenchymal echogenicity. Portal vein is patent on color Doppler imaging with normal direction of blood flow towards the liver. Other: Right kidney is unremarkable measuring 10.9 cm. Visualized portions of the pancreas are normal. IMPRESSION: 1. Cholelithiasis, with sonographic evidence of acute cholecystitis. Electronically Signed   By: Sharlet Salina M.D.   On: 04/23/2020 17:57    Anti-infectives: Anti-infectives (From admission, onward)   Start     Dose/Rate Route Frequency Ordered Stop   04/24/20 0200  cefTRIAXone (ROCEPHIN) 2 g in sodium chloride 0.9 % 100 mL IVPB        2 g 200 mL/hr over 30 Minutes Intravenous Every 24 hours 04/24/20 0040  Assessment/Plan: Acute calculous cholecystitis I recommend proceeding with laparoscopic cholecystectomy with possible cholangiogram. Discussed risks of  surgery including bleeding, pain, scarring, intraabdominal injury specifically to the common bile duct and sequelae, bile leak, conversion to open surgery, blood clot, pneumonia, heart attack, stroke, failure to resolve symptoms, etc. Questions welcomed and answered. Possible discharge this afternoon pending intra- and post-op course  LOS: 0 days    Berna Bue 04/24/2020

## 2020-04-24 NOTE — Anesthesia Preprocedure Evaluation (Signed)
Anesthesia Evaluation  Patient identified by MRN, date of birth, ID band Patient awake    Reviewed: Allergy & Precautions, NPO status , Patient's Chart, lab work & pertinent test results  History of Anesthesia Complications Negative for: history of anesthetic complications  Airway Mallampati: II  TM Distance: >3 FB Neck ROM: Full    Dental  (+) Dental Advisory Given, Teeth Intact   Pulmonary neg pulmonary ROS, neg recent URI,    breath sounds clear to auscultation       Cardiovascular negative cardio ROS   Rhythm:Regular     Neuro/Psych negative neurological ROS  negative psych ROS   GI/Hepatic Neg liver ROS, Cholecystitis   Endo/Other  negative endocrine ROS  Renal/GU negative Renal ROS     Musculoskeletal negative musculoskeletal ROS (+)   Abdominal   Peds  Hematology negative hematology ROS (+)   Anesthesia Other Findings   Reproductive/Obstetrics                             Anesthesia Physical Anesthesia Plan  ASA: I  Anesthesia Plan: General   Post-op Pain Management:    Induction: Intravenous  PONV Risk Score and Plan: 3 and Ondansetron and Dexamethasone  Airway Management Planned: Oral ETT  Additional Equipment: None  Intra-op Plan:   Post-operative Plan: Extubation in OR  Informed Consent: I have reviewed the patients History and Physical, chart, labs and discussed the procedure including the risks, benefits and alternatives for the proposed anesthesia with the patient or authorized representative who has indicated his/her understanding and acceptance.     Dental advisory given  Plan Discussed with: CRNA and Surgeon  Anesthesia Plan Comments:         Anesthesia Quick Evaluation

## 2020-04-25 ENCOUNTER — Encounter (HOSPITAL_COMMUNITY): Payer: Self-pay | Admitting: Surgery

## 2020-04-27 LAB — SURGICAL PATHOLOGY

## 2020-10-16 DIAGNOSIS — N6452 Nipple discharge: Secondary | ICD-10-CM | POA: Insufficient documentation

## 2021-02-23 LAB — OB RESULTS CONSOLE HIV ANTIBODY (ROUTINE TESTING): HIV: NONREACTIVE

## 2021-02-23 LAB — OB RESULTS CONSOLE ANTIBODY SCREEN: Antibody Screen: NEGATIVE

## 2021-02-23 LAB — HEPATITIS C ANTIBODY: HCV Ab: NEGATIVE

## 2021-02-23 LAB — OB RESULTS CONSOLE ABO/RH: RH Type: POSITIVE

## 2021-02-23 LAB — OB RESULTS CONSOLE GC/CHLAMYDIA
Chlamydia: NEGATIVE
Gonorrhea: NEGATIVE

## 2021-02-23 LAB — OB RESULTS CONSOLE VARICELLA ZOSTER ANTIBODY, IGG: Varicella: IMMUNE

## 2021-02-23 LAB — OB RESULTS CONSOLE RUBELLA ANTIBODY, IGM: Rubella: IMMUNE

## 2021-02-23 LAB — OB RESULTS CONSOLE RPR: RPR: NONREACTIVE

## 2021-02-23 LAB — OB RESULTS CONSOLE HEPATITIS B SURFACE ANTIGEN: Hepatitis B Surface Ag: NEGATIVE

## 2021-05-27 ENCOUNTER — Encounter: Payer: Self-pay | Admitting: Emergency Medicine

## 2021-05-27 ENCOUNTER — Ambulatory Visit
Admission: EM | Admit: 2021-05-27 | Discharge: 2021-05-27 | Disposition: A | Discharge status: Home / Self Care | Payer: BC Managed Care – PPO | Attending: Urgent Care | Admitting: Urgent Care

## 2021-05-27 ENCOUNTER — Other Ambulatory Visit: Payer: Self-pay

## 2021-05-27 DIAGNOSIS — H65191 Other acute nonsuppurative otitis media, right ear: Secondary | ICD-10-CM | POA: Diagnosis not present

## 2021-05-27 DIAGNOSIS — Z3A22 22 weeks gestation of pregnancy: Secondary | ICD-10-CM

## 2021-05-27 DIAGNOSIS — J3489 Other specified disorders of nose and nasal sinuses: Secondary | ICD-10-CM

## 2021-05-27 DIAGNOSIS — R062 Wheezing: Secondary | ICD-10-CM

## 2021-05-27 MED ORDER — ALBUTEROL SULFATE HFA 108 (90 BASE) MCG/ACT IN AERS: 1.0000 | INHALATION_SPRAY | Freq: Four times a day (QID) | RESPIRATORY_TRACT | 0 refills | Status: AC | PRN

## 2021-05-27 MED ORDER — CETIRIZINE HCL 10 MG PO TABS: 10.0000 mg | ORAL_TABLET | Freq: Every day | ORAL | 0 refills | Status: AC

## 2021-05-27 MED ORDER — PSEUDOEPHEDRINE HCL 30 MG PO TABS: 30.0000 mg | ORAL_TABLET | Freq: Three times a day (TID) | ORAL | 0 refills | Status: DC | PRN

## 2021-05-27 MED ORDER — AMOXICILLIN 875 MG PO TABS: 875.0000 mg | ORAL_TABLET | Freq: Two times a day (BID) | ORAL | 0 refills | Status: DC

## 2021-05-27 NOTE — ED Provider Notes (Signed)
[facility]   MRN: [id number] DOB: [birth year]  Subjective:   Lindsay Richardson is a 25 y.o. female that is [redacted] weeks pregnant presenting for 2-day history of acute onset persistent and worsening right ear pain.  She is also had some runny and stuffy nose, coughing and wheezing.  She has never been diagnosed with asthma but she has multiple family members including her siblings and parents with this diagnosis.  She does not have an inhaler.  Patient went and got a COVID test today and was negative.  Currently takes prenatal vitamins only.  No Known Allergies  Past Medical History:  Diagnosis Date   Medical history non-contributory      Past Surgical History:  Procedure Laterality Date   CHOLECYSTECTOMY N/A 04/24/2020   Procedure: LAPAROSCOPIC CHOLECYSTECTOMY;  Surgeon: Berna Bue, MD;  Location: [facility] OR;  Service: General;  Laterality: N/A;   MOUTH SURGERY      No family history on file.  Social History   Tobacco Use   Smoking status: Never   Smokeless tobacco: Never  Vaping Use   Vaping Use: Never used  Substance Use Topics   Alcohol use: No   Drug use: Never    ROS   Objective:   Vitals: BP 129/67 (BP Location: Left Arm)   Pulse (!) 113   Temp 98.1 F (36.7 C) (Oral)   Ht 4\' 11"  (1.499 m)   SpO2 95%   BMI 28.88 kg/m   Physical Exam Constitutional:      General: She is not in acute distress.    Appearance: Normal appearance. She is well-developed. She is not ill-appearing, toxic-appearing or diaphoretic.  HENT:     Head: Normocephalic and atraumatic.     Right Ear: Ear canal and external ear normal. No drainage or tenderness. No middle ear effusion. There is no impacted cerumen. Tympanic membrane is not erythematous.     Left Ear: Tympanic membrane, ear canal and external ear normal. No drainage or tenderness.  No middle ear effusion. There is no impacted cerumen. Tympanic membrane is not erythematous.     Ears:     Comments:  Bulging and erythematous right TM with exquisite tenderness using the otoscope.    Nose: Nose normal. No congestion or rhinorrhea.     Mouth/Throat:     Mouth: Mucous membranes are moist. No oral lesions.     Pharynx: Oropharynx is clear. No pharyngeal swelling, oropharyngeal exudate, posterior oropharyngeal erythema or uvula swelling.     Tonsils: No tonsillar exudate or tonsillar abscesses.  Eyes:     General: No scleral icterus.       Right eye: No discharge.        Left eye: No discharge.     Extraocular Movements: Extraocular movements intact.     Right eye: Normal extraocular motion.     Left eye: Normal extraocular motion.     Conjunctiva/sclera: Conjunctivae normal.     Pupils: Pupils are equal, round, and reactive to light.  Cardiovascular:     Rate and Rhythm: Normal rate and regular rhythm.     Pulses: Normal pulses.     Heart sounds: Normal heart sounds. No murmur heard.   No friction rub. No gallop.  Pulmonary:     Effort: Pulmonary effort is normal. No respiratory distress.     Breath sounds: No stridor. Wheezing present. No rhonchi or rales.  Musculoskeletal:     Cervical back: Normal range of motion and neck supple.  Lymphadenopathy:  Cervical: No cervical adenopathy.  Skin:    General: Skin is warm and dry.     Findings: No rash.  Neurological:     General: No focal deficit present.     Mental Status: She is alert and oriented to person, place, and time.  Psychiatric:        Mood and Affect: Mood normal.        Behavior: Behavior normal.        Thought Content: Thought content normal.        Judgment: Judgment normal.     Assessment and Plan :   PDMP not reviewed this encounter.  1. Other non-recurrent acute nonsuppurative otitis media of right ear   2. Wheezing   3. Stuffy and runny nose   4. [redacted] weeks gestation of pregnancy     Start amoxicillin to cover for otitis media. Use supportive care otherwise including Zyrtec and pseudoephedrine which  are safe in pregnancy.  Also provided her with a prescription for albuterol given her wheezing.  Suspect that she has underlying asthma that is undiagnosed.  Follow-up with obstetrician and check with them if she has any concerns about medications in pregnancy.  Counseled patient on potential for adverse effects with medications prescribed/recommended today, ER and return-to-clinic precautions discussed, patient verbalized understanding.    Wallis Bamberg, [place] 05/27/21 1910

## 2021-05-27 NOTE — ED Triage Notes (Signed)
Patient c/o right ear pain x 2 days, nasal congestion, non-productive cough.  Home COVID test negative.  Patient is not vaccinated for COVID.

## 2021-08-24 LAB — OB RESULTS CONSOLE GBS: GBS: NEGATIVE

## 2021-09-05 NOTE — L&D Delivery Note (Signed)
Delivery Note Pt pushed for about and at 2:41 PM a viable female was delivered via Vaginal, Spontaneous (Presentation: Left Occiput Posterior).  APGAR: 8, 9; weight pending. Anterior and posterior shoulders delivered spontaneously with body closely following. Short umbilical cord noted.  After a minute delay, the cord was clamped and cut with infant on mothers abdomen. Cord blood was obtained.     Placenta status: Spontaneous;Expressed, Intact.  Lindsay Richardson. Cord: 3 vessels with the following complications: Short.  Cord pH: n/a  Pt was noted to have a steady gush of blood thus pitocin was bolused, of rectal cytotec was placed and one dose of TXA given with bleeding improvement noted. Fundus remained firm.  Anesthesia: Epidural Episiotomy: None Lacerations: 2nd degree;Perineal Suture Repair: 2.0 vicryl Est. Blood Loss (mL): 650  Mom to postpartum.  Baby to Couplet care / Skin to Skin They do not desire circumcision for baby .  Cathrine Muster 09/22/2021, 3:15 PM

## 2021-09-21 ENCOUNTER — Encounter (HOSPITAL_COMMUNITY): Payer: Self-pay | Admitting: *Deleted

## 2021-09-21 ENCOUNTER — Other Ambulatory Visit: Payer: Self-pay

## 2021-09-22 ENCOUNTER — Inpatient Hospital Stay (HOSPITAL_COMMUNITY)
Admission: AD | Admit: 2021-09-22 | Discharge: 2021-09-23 | DRG: 806 | Disposition: A | Discharge status: Home / Self Care | Payer: BC Managed Care – PPO | Attending: Obstetrics and Gynecology | Admitting: Obstetrics and Gynecology

## 2021-09-22 ENCOUNTER — Inpatient Hospital Stay (HOSPITAL_COMMUNITY): Payer: BC Managed Care – PPO | Admitting: Anesthesiology

## 2021-09-22 ENCOUNTER — Inpatient Hospital Stay (HOSPITAL_COMMUNITY): Payer: BC Managed Care – PPO

## 2021-09-22 ENCOUNTER — Encounter (HOSPITAL_COMMUNITY): Payer: Self-pay | Admitting: Obstetrics and Gynecology

## 2021-09-22 DIAGNOSIS — Z3490 Encounter for supervision of normal pregnancy, unspecified, unspecified trimester: Secondary | ICD-10-CM

## 2021-09-22 DIAGNOSIS — D62 Acute posthemorrhagic anemia: Secondary | ICD-10-CM | POA: Diagnosis not present

## 2021-09-22 DIAGNOSIS — Z3A39 39 weeks gestation of pregnancy: Secondary | ICD-10-CM | POA: Diagnosis not present

## 2021-09-22 DIAGNOSIS — O26893 Other specified pregnancy related conditions, third trimester: Secondary | ICD-10-CM | POA: Diagnosis present

## 2021-09-22 DIAGNOSIS — Z20822 Contact with and (suspected) exposure to covid-19: Secondary | ICD-10-CM | POA: Diagnosis present

## 2021-09-22 DIAGNOSIS — O9081 Anemia of the puerperium: Secondary | ICD-10-CM | POA: Diagnosis not present

## 2021-09-22 HISTORY — DX: Unspecified iridocyclitis: H20.9

## 2021-09-22 HISTORY — DX: Cholecystitis, unspecified: K81.9

## 2021-09-22 HISTORY — DX: Unspecified iridocyclitis: H40.40X0

## 2021-09-22 LAB — CBC
HCT: 34.6 % — ABNORMAL LOW (ref 36.0–46.0)
Hemoglobin: 11.2 g/dL — ABNORMAL LOW (ref 12.0–15.0)
MCH: 27.3 pg (ref 26.0–34.0)
MCHC: 32.4 g/dL (ref 30.0–36.0)
MCV: 84.2 fL (ref 80.0–100.0)
Platelets: 165 10*3/uL (ref 150–400)
RBC: 4.11 MIL/uL (ref 3.87–5.11)
RDW: 14.9 % (ref 11.5–15.5)
WBC: 8 10*3/uL (ref 4.0–10.5)
nRBC: 0 % (ref 0.0–0.2)

## 2021-09-22 LAB — RESP PANEL BY RT-PCR (FLU A&B, COVID) ARPGX2
Influenza A by PCR: NEGATIVE
Influenza B by PCR: NEGATIVE
SARS Coronavirus 2 by RT PCR: NEGATIVE

## 2021-09-22 LAB — TYPE AND SCREEN
ABO/RH(D): O POS
Antibody Screen: NEGATIVE

## 2021-09-22 LAB — RPR: RPR Ser Ql: NONREACTIVE

## 2021-09-22 MED ORDER — BUTORPHANOL TARTRATE 1 MG/ML IJ SOLN: 1.0000 mg | INTRAMUSCULAR | Status: DC | PRN

## 2021-09-22 MED ORDER — TRANEXAMIC ACID-NACL 1000-0.7 MG/100ML-% IV SOLN
1000.0000 mg | Freq: Once | INTRAVENOUS | Status: AC
  Administered 2021-09-22: 1000 mg via INTRAVENOUS

## 2021-09-22 MED ORDER — IBUPROFEN 600 MG PO TABS
600.0000 mg | ORAL_TABLET | Freq: Four times a day (QID) | ORAL | Status: DC
  Administered 2021-09-22 – 2021-09-23 (×4): 600 mg via ORAL
  Filled 2021-09-22 (×5): qty 1

## 2021-09-22 MED ORDER — ACETAMINOPHEN 325 MG PO TABS: 650.0000 mg | ORAL_TABLET | ORAL | Status: DC | PRN

## 2021-09-22 MED ORDER — EPHEDRINE 5 MG/ML INJ: 10.0000 mg | INTRAVENOUS | Status: DC | PRN

## 2021-09-22 MED ORDER — DIPHENHYDRAMINE HCL 50 MG/ML IJ SOLN: 12.5000 mg | INTRAMUSCULAR | Status: DC | PRN

## 2021-09-22 MED ORDER — ONDANSETRON HCL 4 MG/2ML IJ SOLN
4.0000 mg | Freq: Four times a day (QID) | INTRAMUSCULAR | Status: DC | PRN
Start: 2021-09-22 — End: 2021-09-22

## 2021-09-22 MED ORDER — DIBUCAINE (PERIANAL) 1 % EX OINT: 1.0000 "application " | TOPICAL_OINTMENT | CUTANEOUS | Status: DC | PRN

## 2021-09-22 MED ORDER — OXYCODONE-ACETAMINOPHEN 5-325 MG PO TABS: 1.0000 | ORAL_TABLET | ORAL | Status: DC | PRN

## 2021-09-22 MED ORDER — OXYCODONE-ACETAMINOPHEN 5-325 MG PO TABS: 2.0000 | ORAL_TABLET | ORAL | Status: DC | PRN

## 2021-09-22 MED ORDER — LACTATED RINGERS IV SOLN
500.0000 mL | Freq: Once | INTRAVENOUS | Status: AC
  Administered 2021-09-22: 500 mL via INTRAVENOUS

## 2021-09-22 MED ORDER — OXYTOCIN-SODIUM CHLORIDE 30-0.9 UT/500ML-% IV SOLN
2.5000 [IU]/h | INTRAVENOUS | Status: DC
  Administered 2021-09-22: 2.5 [IU]/h via INTRAVENOUS
  Filled 2021-09-22: qty 500

## 2021-09-22 MED ORDER — OXYTOCIN-SODIUM CHLORIDE 30-0.9 UT/500ML-% IV SOLN: 2.5000 [IU]/h | INTRAVENOUS | Status: DC | PRN

## 2021-09-22 MED ORDER — MISOPROSTOL 200 MCG PO TABS
ORAL_TABLET | ORAL | Status: AC
  Filled 2021-09-22: qty 4

## 2021-09-22 MED ORDER — SENNOSIDES-DOCUSATE SODIUM 8.6-50 MG PO TABS
2.0000 | ORAL_TABLET | Freq: Every day | ORAL | Status: DC
  Administered 2021-09-23: 2 via ORAL
  Filled 2021-09-22: qty 2

## 2021-09-22 MED ORDER — TERBUTALINE SULFATE 1 MG/ML IJ SOLN: 0.2500 mg | Freq: Once | INTRAMUSCULAR | Status: DC | PRN

## 2021-09-22 MED ORDER — SIMETHICONE 80 MG PO CHEW: 80.0000 mg | CHEWABLE_TABLET | ORAL | Status: DC | PRN

## 2021-09-22 MED ORDER — COCONUT OIL OIL: 1.0000 "application " | TOPICAL_OIL | Status: DC | PRN

## 2021-09-22 MED ORDER — PHENYLEPHRINE 40 MCG/ML (10ML) SYRINGE FOR IV PUSH (FOR BLOOD PRESSURE SUPPORT)
80.0000 ug | PREFILLED_SYRINGE | INTRAVENOUS | Status: DC | PRN
  Administered 2021-09-22: 80 ug via INTRAVENOUS

## 2021-09-22 MED ORDER — OXYCODONE HCL 5 MG PO TABS: 10.0000 mg | ORAL_TABLET | ORAL | Status: DC | PRN

## 2021-09-22 MED ORDER — OXYTOCIN BOLUS FROM INFUSION
333.0000 mL | Freq: Once | INTRAVENOUS | Status: AC
  Administered 2021-09-22: 333 mL via INTRAVENOUS

## 2021-09-22 MED ORDER — ZOLPIDEM TARTRATE 5 MG PO TABS: 5.0000 mg | ORAL_TABLET | Freq: Every evening | ORAL | Status: DC | PRN

## 2021-09-22 MED ORDER — TETANUS-DIPHTH-ACELL PERTUSSIS 5-2.5-18.5 LF-MCG/0.5 IM SUSY: 0.5000 mL | PREFILLED_SYRINGE | Freq: Once | INTRAMUSCULAR | Status: DC

## 2021-09-22 MED ORDER — FENTANYL-BUPIVACAINE-NACL 0.5-0.125-0.9 MG/250ML-% EP SOLN
12.0000 mL/h | EPIDURAL | Status: DC | PRN
  Administered 2021-09-22: 10.5 mL/h via EPIDURAL
  Filled 2021-09-22: qty 250

## 2021-09-22 MED ORDER — LACTATED RINGERS IV SOLN: 500.0000 mL | INTRAVENOUS | Status: DC | PRN

## 2021-09-22 MED ORDER — DIPHENHYDRAMINE HCL 25 MG PO CAPS: 25.0000 mg | ORAL_CAPSULE | Freq: Four times a day (QID) | ORAL | Status: DC | PRN

## 2021-09-22 MED ORDER — WITCH HAZEL-GLYCERIN EX PADS: 1.0000 "application " | MEDICATED_PAD | CUTANEOUS | Status: DC | PRN

## 2021-09-22 MED ORDER — ONDANSETRON HCL 4 MG PO TABS: 4.0000 mg | ORAL_TABLET | ORAL | Status: DC | PRN

## 2021-09-22 MED ORDER — LIDOCAINE HCL (PF) 1 % IJ SOLN
INTRAMUSCULAR | Status: DC | PRN
  Administered 2021-09-22 (×2): 5 mL via EPIDURAL

## 2021-09-22 MED ORDER — MISOPROSTOL 200 MCG PO TABS
800.0000 ug | ORAL_TABLET | Freq: Once | ORAL | Status: AC
  Administered 2021-09-22: 800 ug via RECTAL

## 2021-09-22 MED ORDER — BENZOCAINE-MENTHOL 20-0.5 % EX AERO
1.0000 "application " | INHALATION_SPRAY | CUTANEOUS | Status: DC | PRN
  Administered 2021-09-23: 1 via TOPICAL
  Filled 2021-09-22: qty 56

## 2021-09-22 MED ORDER — FERROUS SULFATE 325 (65 FE) MG PO TABS
325.0000 mg | ORAL_TABLET | Freq: Two times a day (BID) | ORAL | Status: DC
  Administered 2021-09-23: 325 mg via ORAL
  Filled 2021-09-22: qty 1

## 2021-09-22 MED ORDER — LACTATED RINGERS IV SOLN
INTRAVENOUS | Status: DC
Start: 2021-09-22 — End: 2021-09-22

## 2021-09-22 MED ORDER — OXYTOCIN-SODIUM CHLORIDE 30-0.9 UT/500ML-% IV SOLN
1.0000 m[IU]/min | INTRAVENOUS | Status: DC
  Administered 2021-09-22: 2 m[IU]/min via INTRAVENOUS

## 2021-09-22 MED ORDER — SOD CITRATE-CITRIC ACID 500-334 MG/5ML PO SOLN: 30.0000 mL | ORAL | Status: DC | PRN

## 2021-09-22 MED ORDER — PRENATAL MULTIVITAMIN CH
1.0000 | ORAL_TABLET | Freq: Every day | ORAL | Status: DC
  Administered 2021-09-23: 1 via ORAL
  Filled 2021-09-22: qty 1

## 2021-09-22 MED ORDER — ACETAMINOPHEN 325 MG PO TABS
650.0000 mg | ORAL_TABLET | ORAL | Status: DC | PRN
Start: 2021-09-22 — End: 2021-09-22

## 2021-09-22 MED ORDER — OXYCODONE HCL 5 MG PO TABS: 5.0000 mg | ORAL_TABLET | ORAL | Status: DC | PRN

## 2021-09-22 MED ORDER — TRANEXAMIC ACID-NACL 1000-0.7 MG/100ML-% IV SOLN
INTRAVENOUS | Status: AC
  Filled 2021-09-22: qty 100

## 2021-09-22 MED ORDER — MISOPROSTOL 25 MCG QUARTER TABLET
25.0000 ug | ORAL_TABLET | ORAL | Status: DC | PRN
  Administered 2021-09-22 (×2): 25 ug via VAGINAL
  Filled 2021-09-22 (×2): qty 1

## 2021-09-22 MED ORDER — LACTATED RINGERS IV SOLN: INTRAVENOUS | Status: DC

## 2021-09-22 MED ORDER — LIDOCAINE HCL (PF) 1 % IJ SOLN: 30.0000 mL | INTRAMUSCULAR | Status: DC | PRN

## 2021-09-22 MED ORDER — ONDANSETRON HCL 4 MG/2ML IJ SOLN: 4.0000 mg | INTRAMUSCULAR | Status: DC | PRN

## 2021-09-22 MED ORDER — PHENYLEPHRINE 40 MCG/ML (10ML) SYRINGE FOR IV PUSH (FOR BLOOD PRESSURE SUPPORT)
80.0000 ug | PREFILLED_SYRINGE | INTRAVENOUS | Status: DC | PRN
  Filled 2021-09-22: qty 10

## 2021-09-22 NOTE — Anesthesia Procedure Notes (Signed)
Epidural Patient location during procedure: OB Start time: 09/22/2021 9:59 AM End time: 09/22/2021 10:07 AM  Staffing Anesthesiologist: Mal Amabile, MD Performed: anesthesiologist   Preanesthetic Checklist Completed: patient identified, IV checked, site marked, risks and benefits discussed, surgical consent, monitors and equipment checked, pre-op evaluation and timeout performed  Epidural Patient position: sitting Prep: DuraPrep and site prepped and draped Patient monitoring: continuous pulse ox and blood pressure Approach: midline Location: L3-L4 Injection technique: LOR air  Needle:  Needle type: Tuohy  Needle gauge: 17 G Needle length: 9 cm and 9 Needle insertion depth: 5 cm Catheter type: closed end flexible Catheter size: 19 Gauge Catheter at skin depth: 10 cm Test dose: negative and Other  Assessment Events: blood not aspirated, injection not painful, no injection resistance, no paresthesia and negative IV test  Additional Notes Patient identified. Risks and benefits discussed including failed block, incomplete  Pain control, post dural puncture headache, nerve damage, paralysis, blood pressure Changes, nausea, vomiting, reactions to medications-both toxic and allergic and post Partum back pain. All questions were answered. Patient expressed understanding and wished to proceed. Sterile technique was used throughout procedure. Epidural site was Dressed with sterile barrier dressing. No paresthesias, signs of intravascular injection Or signs of intrathecal spread were encountered.  Patient was more comfortable after the epidural was dosed. Please see RN's note for documentation of vital signs and FHR which are stable. Reason for block:procedure for pain

## 2021-09-22 NOTE — H&P (Signed)
Lindsay Richardson is a 26 y.o.G50P111 female presenting for elective IOL at 63 4/[redacted]wks gestation. She is dated per LMP which was confirmed with a 9week [place]. She has had a benign prenatal course. GBS neg.  Essential Panel Negative 05/08/2019; Expected risk MaterniT 21 test  OB History     Gravida  3   Para  1   Term  1   Preterm      AB  1   Living  1      SAB  1   IAB      Ectopic      Multiple  0   Live Births  1          Past Medical History:  Diagnosis Date   Cholecystitis    Posner-Schlossman syndrome    Past Surgical History:  Procedure Laterality Date   CHOLECYSTECTOMY N/A 04/24/2020   Procedure: LAPAROSCOPIC CHOLECYSTECTOMY;  Surgeon: Clovis Riley, MD;  Location: [facility];  Service: General;  Laterality: N/A;   MOUTH SURGERY     Family History: family history is not on file. Social History:  reports that she has never smoked. She has never used smokeless tobacco. She reports that she does not drink alcohol and does not use drugs.     Maternal Diabetes: No Genetic Screening: Normal Maternal Ultrasounds/Referrals: Normal Fetal Ultrasounds or other Referrals:  None Maternal Substance Abuse:  No Significant Maternal Medications:  None Significant Maternal Lab Results:  Group B Strep negative Other Comments:  None  Review of Systems  Constitutional:  Negative for fatigue.  Eyes:  Negative for visual disturbance.  Respiratory:  Negative for shortness of breath.   Cardiovascular:  Negative for chest pain, palpitations and leg swelling.  Gastrointestinal:  Positive for abdominal pain.  Genitourinary:  Positive for pelvic pain.  Musculoskeletal:  Positive for back pain.  Neurological:  Negative for light-headedness and headaches.  Psychiatric/Behavioral:  The patient is nervous/anxious.   All other systems reviewed and are negative. Maternal Medical History:  Reason for admission: Elective iol at term  Contractions: Onset was 6-12 hours ago.    Frequency: regular.   Perceived severity is moderate.   Fetal activity: Perceived fetal activity is normal.   Prenatal complications: no prenatal complications Prenatal Complications - Diabetes: none.  Dilation: 4.5 Effacement (%): 60 Station: -2 Exam by:: Dr. Terri Piedra Blood pressure (!) 102/55, pulse 73, temperature 98.1 F (36.7 C), temperature source Oral, resp. rate 16, height 4\' 11"  (1.499 m), weight 80.7 kg, SpO2 100 %, currently breastfeeding. Maternal Exam:  Uterine Assessment: Contraction strength is moderate.  Contraction frequency is regular.  Abdomen: Patient reports generalized tenderness.  Estimated fetal weight is AGA.   Fetal presentation: vertex Introitus: Normal vulva. Normal vagina.  Amniotic fluid character: clear. Pelvis: adequate for delivery.   Cervix: Cervix evaluated by digital exam.     Fetal Exam Fetal Monitor Review: Baseline rate: 120.  Variability: moderate (6-25 bpm).   Pattern: accelerations present and no decelerations.   Fetal State Assessment: Category I - tracings are normal.  Physical Exam Vitals and nursing note reviewed. Exam conducted with a chaperone present.  Constitutional:      Appearance: Normal appearance.  Cardiovascular:     Rate and Rhythm: Normal rate.     Pulses: Normal pulses.  Pulmonary:     Effort: Pulmonary effort is normal.  Abdominal:     Palpations: Abdomen is soft.     Tenderness: There is generalized abdominal tenderness.  Genitourinary:  General: Normal vulva.  Musculoskeletal:        General: Normal range of motion.     Cervical back: Normal range of motion.  Skin:    General: Skin is warm.     Capillary Refill: Capillary refill takes 2 to 3 seconds.  Neurological:     General: No focal deficit present.     Mental Status: She is alert and oriented to person, place, and time. Mental status is at baseline.  Psychiatric:        Mood and Affect: Mood normal.    Prenatal labs: ABO, Rh: --/--/O POS (01/18  0027) Antibody: NEG (01/18 0027) Rubella: Immune (06/21 0000) RPR: NON REACTIVE (01/18 0027)  HBsAg: Negative (06/21 0000)  HIV: Non-reactive (06/21 0000)  GBS: Negative/-- (12/20 0000)   Assessment/Plan: [id number] female at 59 4/[redacted]wks gestation here for elective IOL - Admitted and received two doses of vaginal cytotec overnight - Currently comfortable with epidural - AROM done - clear fluid, copious noted - Augment with pitocin if indicated - Anticipate svd     Venetia Night Sufyan Meidinger 09/22/2021, 11:34 AM

## 2021-09-22 NOTE — Lactation Note (Signed)
This note was copied from a baby's chart. Lactation Consultation Note  Patient Name: Lindsay Richardson [id number] Date: 09/22/2021 Reason for consult: Initial assessment;Term Age:25 hours P2, term female infant.  Per mom, infant is latching well he recently breastfeed for 5 minutes at 1930 and she is feeding him by hunger cues. LC did not observe latch at this time infant was asleep in the basinet.  Mom is working on increasing infant's duration at the breast and will attempt to latch him on both breast during a feeding. LC suggested breastfeeding infant skin to skin, do breast compressions: gently stroking infant's neck and shoulder, breast compressions and talking to infant while breastfeeding. Mom knows to breastfeed infant according to feeding cues, 8 to 12 or more times within 24 hours, skin to skin. Mom knows to ask RN/LC if she has any breastfeeding questions, concerns or need assistance with latching infant at the breast. Mom made aware of O/P services, breastfeeding support groups, community resources, and our phone # for post-discharge questions.    Maternal Data Has patient been taught Hand Expression?: Yes Does the patient have breastfeeding experience prior to this delivery?: Yes How long did the patient breastfeed?: Per mom, she BF her 1st child for 6 months who is less than 2 years.  Feeding Mother's Current Feeding Choice: Breast Milk  LATCH Score              Lactation Tools Discussed/Used    Interventions    Discharge    Consult Status Consult Status: Follow-up Date: 09/23/21 Follow-up type: In-patient    Danelle Earthly 09/22/2021, 8:21 PM

## 2021-09-22 NOTE — Progress Notes (Signed)
Patient ID: Lindsay Richardson, female   DOB: 09/12/1995, 26 y.o.   MRN: [id number] Pt complained of increased vaginal pressure with involuntary urge to push.  Anterior lip noted.  VSS EFM - cat 1, 120s  Trial of pushing reduced lip  Will continue pushing Anticipate svd

## 2021-09-22 NOTE — Anesthesia Preprocedure Evaluation (Signed)
Anesthesia Evaluation  Patient identified by MRN, date of birth, ID band Patient awake    Reviewed: Allergy & Precautions, Patient's Chart, lab work & pertinent test results  Airway Mallampati: II  TM Distance: >3 FB Neck ROM: Full    Dental no notable dental hx. (+) Teeth Intact, Dental Advisory Given   Pulmonary neg pulmonary ROS,    Pulmonary exam normal        Cardiovascular negative cardio ROS Normal cardiovascular exam Rhythm:Regular Rate:Normal     Neuro/Psych negative neurological ROS  negative psych ROS   GI/Hepatic Neg liver ROS, GERD  ,  Endo/Other  Obesity  Renal/GU negative Renal ROS  negative genitourinary   Musculoskeletal negative musculoskeletal ROS (+)   Abdominal (+) + obese,   Peds  Hematology  (+) anemia ,   Anesthesia Other Findings   Reproductive/Obstetrics (+) Pregnancy                             Anesthesia Physical Anesthesia Plan  ASA: 2  Anesthesia Plan: Epidural   Post-op Pain Management:    Induction:   PONV Risk Score and Plan:   Airway Management Planned: Natural Airway  Additional Equipment:   Intra-op Plan:   Post-operative Plan:   Informed Consent: I have reviewed the patients History and Physical, chart, labs and discussed the procedure including the risks, benefits and alternatives for the proposed anesthesia with the patient or authorized representative who has indicated his/her understanding and acceptance.       Plan Discussed with: Anesthesiologist  Anesthesia Plan Comments:         Anesthesia Quick Evaluation

## 2021-09-22 NOTE — Anesthesia Postprocedure Evaluation (Signed)
Anesthesia Post Note  Patient: Patent attorney  Procedure(s) Performed: AN AD HOC LABOR EPIDURAL     Patient location during evaluation: Mother Baby Anesthesia Type: Epidural Level of consciousness: awake and alert Pain management: pain level controlled Vital Signs Assessment: post-procedure vital signs reviewed and stable Respiratory status: spontaneous breathing, nonlabored ventilation and respiratory function stable Cardiovascular status: stable Postop Assessment: no headache, no backache and epidural receding Anesthetic complications: no   No notable events documented.  Last Vitals:  Vitals:   09/22/21 1615 09/22/21 1649  BP: 105/73 105/69  Pulse: 96 96  Resp: 18 16  Temp:  37 C  SpO2: 98% 100%    Last Pain:  Vitals:   09/22/21 1649  TempSrc:   PainSc: 0-No pain   Pain Goal:                Epidural/Spinal Function Cutaneous sensation: Normal sensation (09/22/21 1649)  Amada Hallisey

## 2021-09-22 NOTE — Lactation Note (Signed)
This note was copied from a baby's chart. Lactation Consultation Note  Patient Name: Boy Johnsie Moscoso [id number] Date: 09/22/2021 Reason for consult: L&D Initial assessment;Primapara;1st time breastfeeding;Term;Other (Comment) (LC visit at 40 mins . when [facility] arrived in the room , LD RN mentioned it wasn't a good time due to  mom not feeling well and vomiting. LC RN reported the baby BF 10 mins after birth.) Age:25 mins   Maternal Data    Feeding Mother's Current Feeding Choice: Breast Milk  LATCH Score                    Lactation Tools Discussed/Used    Interventions    Discharge    Consult Status Consult Status: Follow-up from L&D Date: 09/22/21 Follow-up type: In-patient    Matilde Sprang Aubry Rankin 09/22/2021, 3:37 PM

## 2021-09-23 LAB — CBC
HCT: 26.2 % — ABNORMAL LOW (ref 36.0–46.0)
Hemoglobin: 8.5 g/dL — ABNORMAL LOW (ref 12.0–15.0)
MCH: 27.2 pg (ref 26.0–34.0)
MCHC: 32.4 g/dL (ref 30.0–36.0)
MCV: 84 fL (ref 80.0–100.0)
Platelets: 128 10*3/uL — ABNORMAL LOW (ref 150–400)
RBC: 3.12 MIL/uL — ABNORMAL LOW (ref 3.87–5.11)
RDW: 15.4 % (ref 11.5–15.5)
WBC: 10.6 10*3/uL — ABNORMAL HIGH (ref 4.0–10.5)
nRBC: 0 % (ref 0.0–0.2)

## 2021-09-23 LAB — BIRTH TISSUE RECOVERY COLLECTION (PLACENTA DONATION)

## 2021-09-23 NOTE — Discharge Instructions (Signed)
You make take ibuprofen 600mg  every 6 hours as needed for pain.  You may add tylenol (acetaminophen) 1000mg  every 6 hours for pain as neede

## 2021-09-23 NOTE — Lactation Note (Signed)
This note was copied from a baby's chart. Lactation Consultation Note  Patient Name: Boy Akeylah Lemond [id number] Date: 09/23/2021 Reason for consult: Follow-up assessment;Term;Infant weight loss;Breastfeeding assistance;Other (Comment) (Mom latching baby in te cradle when L:C entered the room , baby noted to be on and off , shallow latch and fussy. Mellette worked with mom in the different positions and the football on the left breast worked.) Age:25 hours Latch score 8 .  LC reviewed BF D/C teaching.  Mom aware of [place] resources after delivery.   Maternal Data    Feeding Mother's Current Feeding Choice: Breast Milk  LATCH Score Latch: Repeated attempts needed to sustain latch, nipple held in mouth throughout feeding, stimulation needed to elicit sucking reflex.  Audible Swallowing: Spontaneous and intermittent  Type of Nipple: Everted at rest and after stimulation  Comfort (Breast/Nipple): Soft / non-tender  Hold (Positioning): Assistance needed to correctly position infant at breast and maintain latch.  LATCH Score: 8   Lactation Tools Discussed/Used Tools: Pump;Flanges Flange Size: 24;27 Breast pump type: Manual Pump Education: Milk Storage;Setup, frequency, and cleaning Reason for Pumping: PRN when milk comes in  Interventions Interventions: Breast feeding basics reviewed;Assisted with latch;Skin to skin;Breast compression;Adjust position;Support pillows;Position options;Hand pump  Discharge Discharge Education: Engorgement and breast care;Warning signs for feeding baby Pump: Personal;DEBP;Manual  Consult Status Consult Status: Complete Date: 09/23/21    Myer Haff 09/23/2021, 10:57 AM

## 2021-09-23 NOTE — Discharge Summary (Signed)
Postpartum Discharge Summary  Date of Service updated 09/23/21     Patient Name: Lindsay Richardson DOB: 1995-11-25 MRN: [id number]  Date of admission: 09/22/2021 Delivery date:09/22/2021  Delivering provider: Edwinna Areola  Date of discharge: 09/23/2021  Admitting diagnosis: Term pregnancy [Z34.90] Intrauterine pregnancy: [redacted]w[redacted]d     Secondary diagnosis:  Principal Problem:   Term pregnancy Active Problems:   Vaginal delivery  Additional problems: None    Discharge diagnosis: Term Pregnancy Delivered                                              Post partum procedures: none Augmentation: AROM, Pitocin, and Cytotec Complications: None  Hospital course: Induction of Labor With Vaginal Delivery   26 y.o. yo [id number] at [redacted]w[redacted]d was admitted to the hospital 09/22/2021 for induction of labor.  Indication for induction: Favorable cervix at term.  Patient had an uncomplicated labor course as follows: Membrane Rupture Time/Date: 11:15 AM ,09/22/2021   Delivery Method:Vaginal, Spontaneous  Episiotomy: None  Lacerations:  2nd degree;Perineal  Details of delivery can be found in separate delivery note.  Patient had a routine postpartum course. Patient is discharged home 09/23/21.  Newborn Data: Birth date:09/22/2021  Birth time:2:41 PM  Gender:Female  Living status:Living  Apgars:8 ,9  Weight:3610 g    Physical exam  Vitals:   09/22/21 1752 09/22/21 2204 09/23/21 0230 09/23/21 0622  BP: 116/72 98/60 (!) 100/55 107/72  Pulse: 97 79 69 81  Resp: 20 18 18 18   Temp: 99.2 F (37.3 C) 98.6 F (37 C) 97.9 F (36.6 C) 97.9 F (36.6 C)  TempSrc: Oral Oral Oral Oral  SpO2:  97% 100% 100%  Weight:      Height:       General: alert, cooperative, and no distress Lochia: appropriate Uterine Fundus: firm Incision: N/A DVT Evaluation: No evidence of DVT seen on physical exam. Labs: Lab Results  Component Value Date   WBC 10.6 (H) 09/23/2021   HGB 8.5 (L) 09/23/2021   HCT  26.2 (L) 09/23/2021   MCV 84.0 09/23/2021   PLT 128 (L) 09/23/2021   CMP Latest Ref Rng & Units 04/23/2020  Glucose 70 - 99 mg/dL 96  BUN 6 - 20 mg/dL 5(L)  Creatinine 04/25/2020 - 1.00 mg/dL 0.73  Sodium 7.10 - 626 mmol/L 137  Potassium 3.5 - 5.1 mmol/L 4.2  Chloride 98 - 111 mmol/L 104  CO2 22 - 32 mmol/L 24  Calcium 8.9 - 10.3 mg/dL 9.4  Total Protein 6.5 - 8.1 g/dL 7.5  Total Bilirubin 0.3 - 1.2 mg/dL 948)  Alkaline Phos 38 - 126 U/L 80  AST 15 - 41 U/L 26  ALT 0 - 44 U/L 23   Edinburgh Score: Edinburgh Postnatal Depression Scale Screening Tool 09/22/2021  I have been able to laugh and see the funny side of things. 0  I have looked forward with enjoyment to things. 0  I have blamed myself unnecessarily when things went wrong. 1  I have been anxious or worried for no good reason. 1  I have felt scared or panicky for no good reason. 1  Things have been getting on top of me. 2  I have been so unhappy that I have had difficulty sleeping. 0  I have felt sad or miserable. 0  I have been so unhappy that I have been crying.  0  The thought of harming myself has occurred to me. 0  Edinburgh Postnatal Depression Scale Total 5      After visit meds:  Allergies as of 09/23/2021   No Known Allergies      Medication List     STOP taking these medications    amoxicillin 875 MG tablet Commonly known as: AMOXIL   pseudoephedrine 30 MG tablet Commonly known as: SUDAFED       TAKE these medications    albuterol 108 (90 Base) MCG/ACT inhaler Commonly known as: VENTOLIN HFA Inhale 1-2 puffs into the lungs every 6 (six) hours as needed for wheezing or shortness of breath.   cetirizine 10 MG tablet Commonly known as: ZyrTEC Allergy Take 1 tablet (10 mg total) by mouth daily.   prenatal multivitamin Tabs tablet Take 1 tablet by mouth daily at 12 noon.         Discharge home in stable condition Infant Feeding: Breast Infant Disposition:home with mother Discharge  instruction: per After Visit Summary and Postpartum booklet. Activity: Advance as tolerated. Pelvic rest for 6 weeks.  Diet: routine diet Anticipated Birth Control: Unsure Postpartum Appointment:6 weeks Additional Postpartum F/U:  n/a Future Appointments:No future appointments. Follow up Visit:      09/23/2021 [facility] GEFFEL Chestine Spore, MD

## 2021-09-23 NOTE — Progress Notes (Signed)
Patient is doing well.  She is ambulating, voiding, tolerating PO.  Pain control is good.  Lochia is appropriate.  She desires discharge today at 24 hours  Vitals:   09/22/21 1752 09/22/21 2204 09/23/21 0230 09/23/21 0622  BP: 116/72 98/60 (!) 100/55 107/72  Pulse: 97 79 69 81  Resp: 20 18 18 18   Temp: 99.2 F (37.3 C) 98.6 F (37 C) 97.9 F (36.6 C) 97.9 F (36.6 C)  TempSrc: Oral Oral Oral Oral  SpO2:  97% 100% 100%  Weight:      Height:        NAD Fundus firm Ext: trace edema bilaterally  Lab Results  Component Value Date   WBC 10.6 (H) 09/23/2021   HGB 8.5 (L) 09/23/2021   HCT 26.2 (L) 09/23/2021   MCV 84.0 09/23/2021   PLT 128 (L) 09/23/2021    --/--/O POS (01/18 0027)/RImmune  A/P 26 y.o. CQ:[id number] PPD#1. Routine care.   Mild anemia--hgb 8.5, likely from abla (ebl 653mL, mild atony, received TXA).  She is asymptomatic..  Pulse has been 60-80s, BP normal.  Plan PO iron qod at discharge Expect d/c today.    [place]

## 2021-10-05 ENCOUNTER — Telehealth (HOSPITAL_COMMUNITY): Payer: Self-pay | Admitting: *Deleted

## 2021-10-05 NOTE — Telephone Encounter (Signed)
Mom reports feeling good. No concerns about herself at this time. EPDS=0(Hospital score=5) Mom reports baby is doing well. Feeding, peeing, and pooping without difficulty. Safe sleep reviewed. Mom reports no concerns about baby at present.  Duffy Rhody, RN 10-05-21 at 3:56pm

## 2022-01-27 IMAGING — US US ABDOMEN LIMITED
1 series · 14 of 25 positions shown · non-contrast
Comparison: 09/10/2019

CLINICAL DATA: Right upper quadrant pain

EXAM:
ULTRASOUND ABDOMEN LIMITED RIGHT UPPER QUADRANT

[Series 1: us abdomen limited ruq · 14 of 149 slices shown]
[im 1/149]
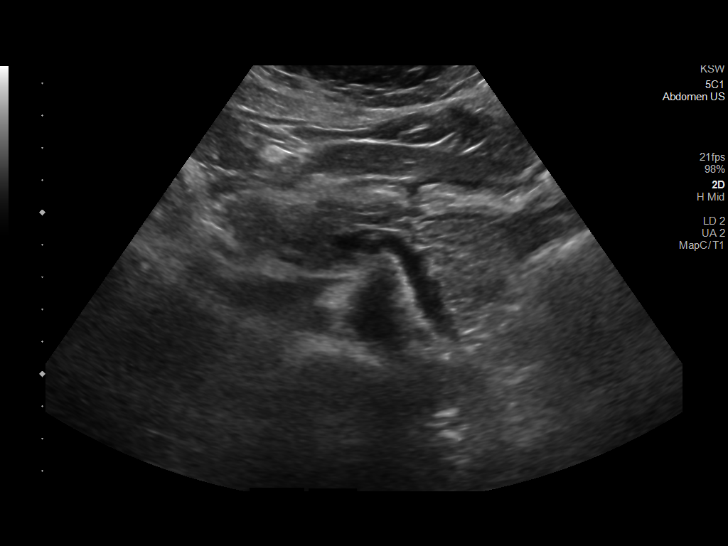
[im 13/149]
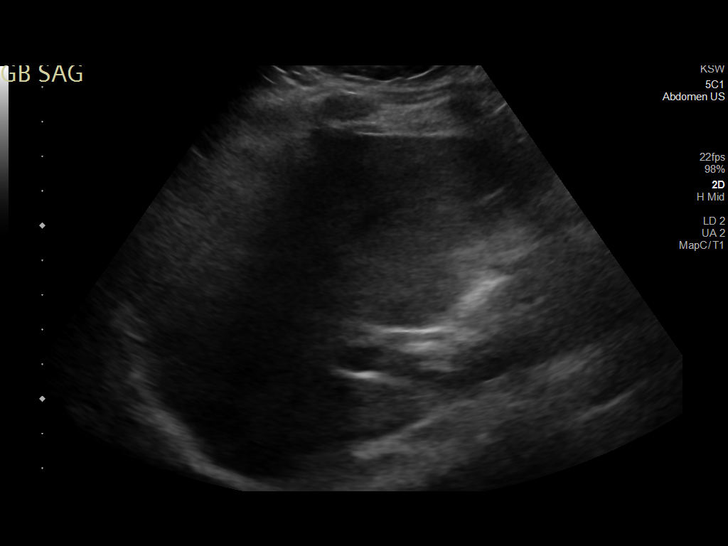
[im 25/149]
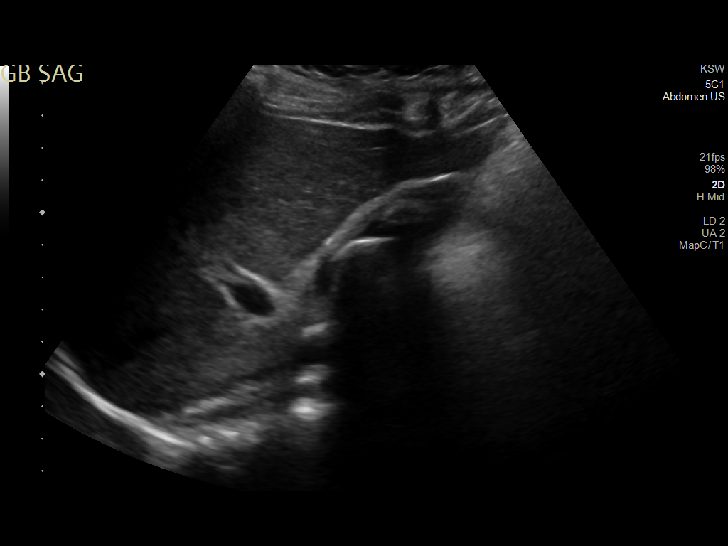
[im 38/149]
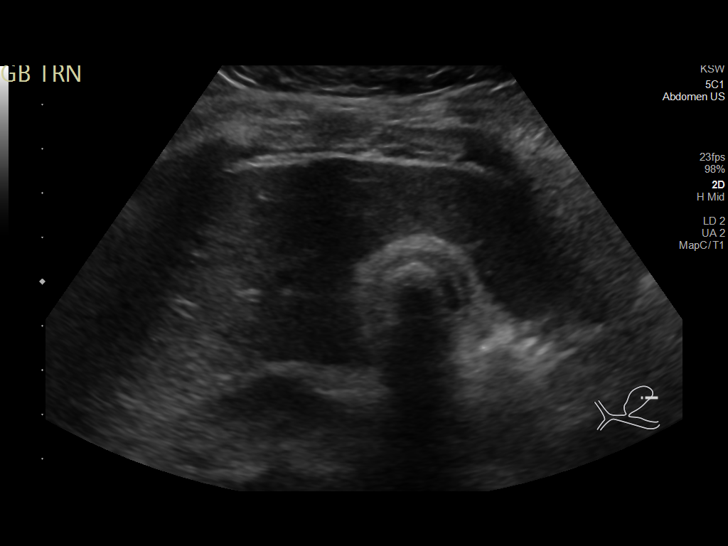
[im 50/149]
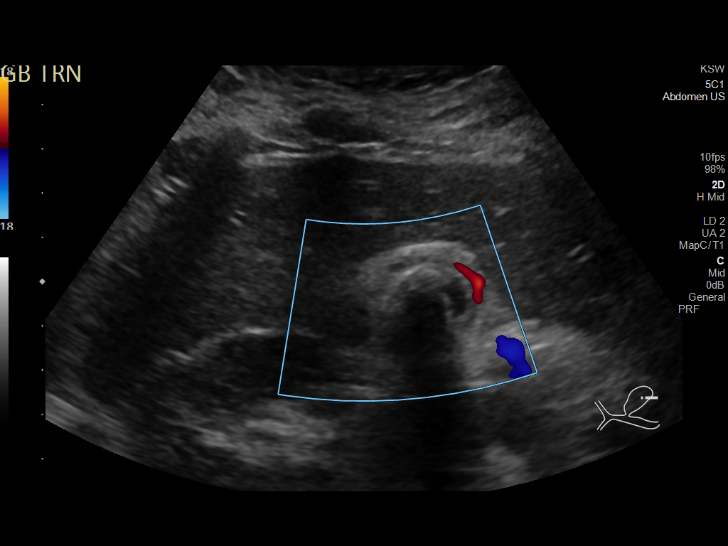
[im 56/149]
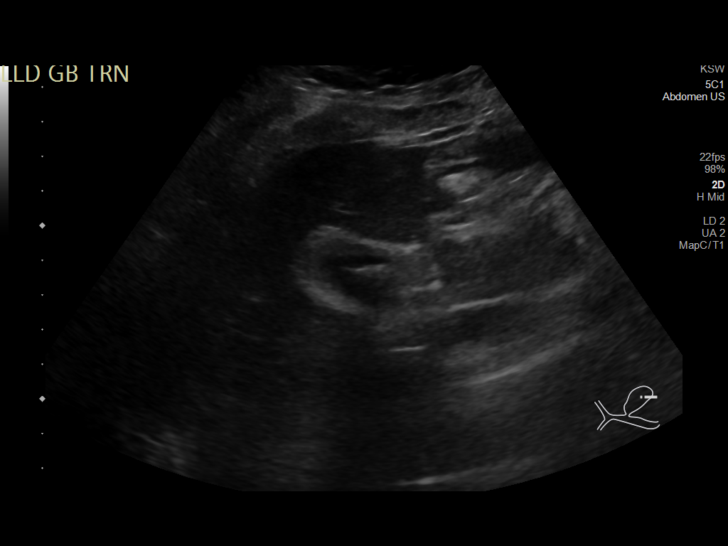
[im 68/149]
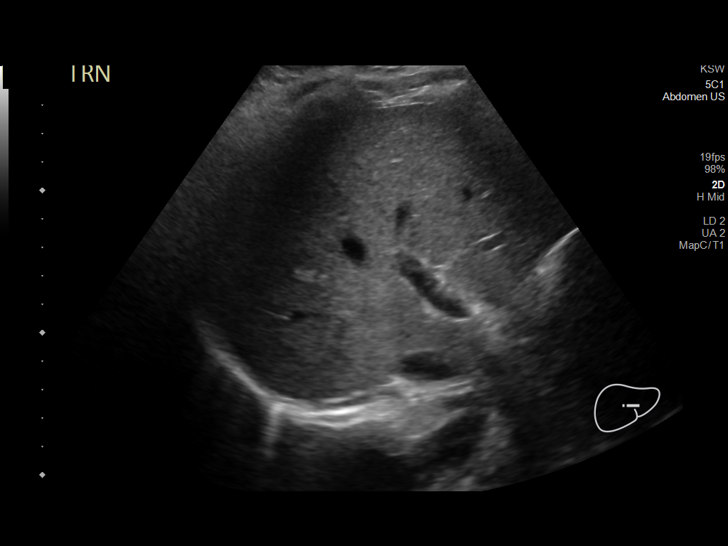
[im 81/149]
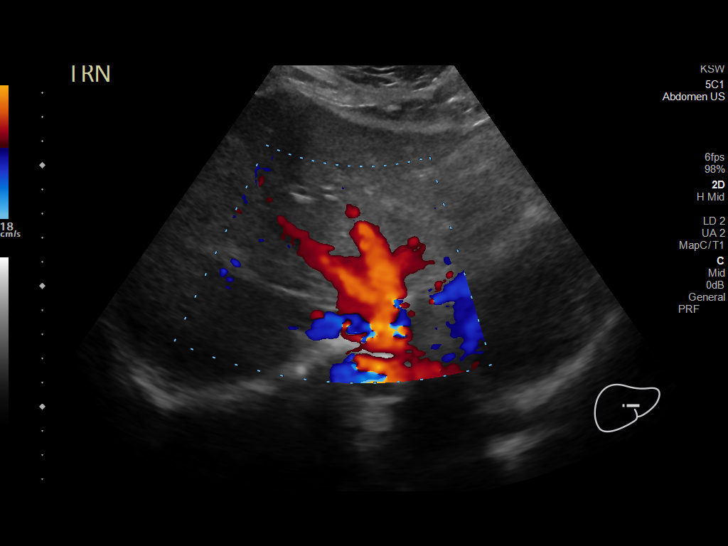
[im 93/149]
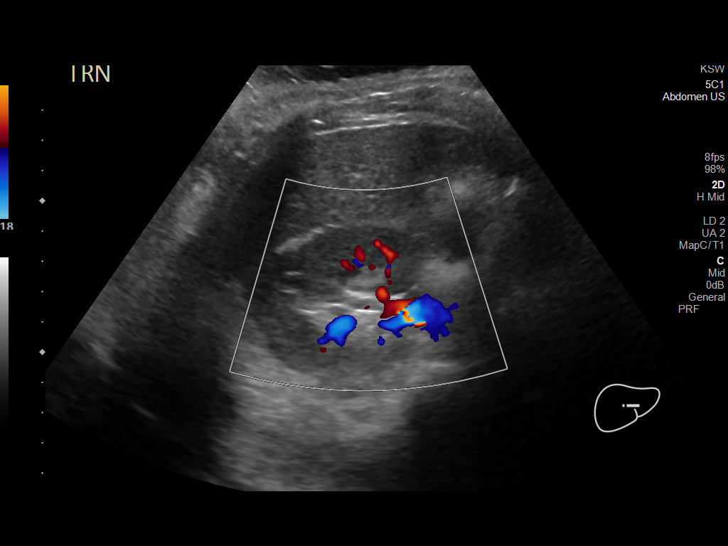
[im 99/149]
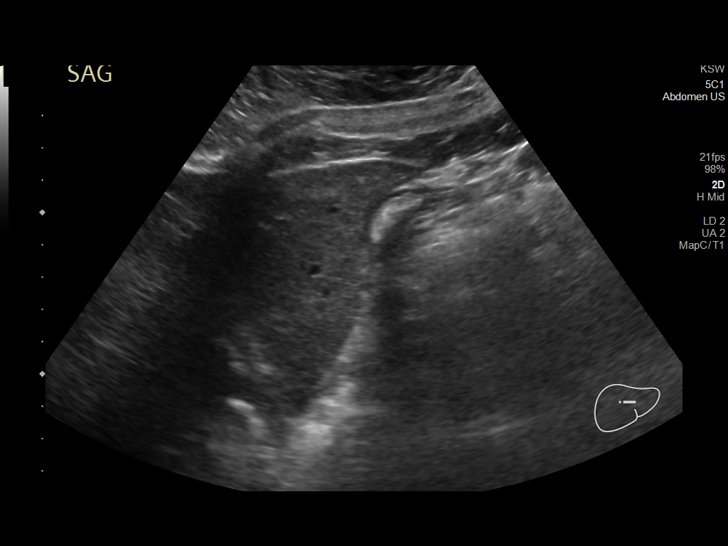
[im 112/149]
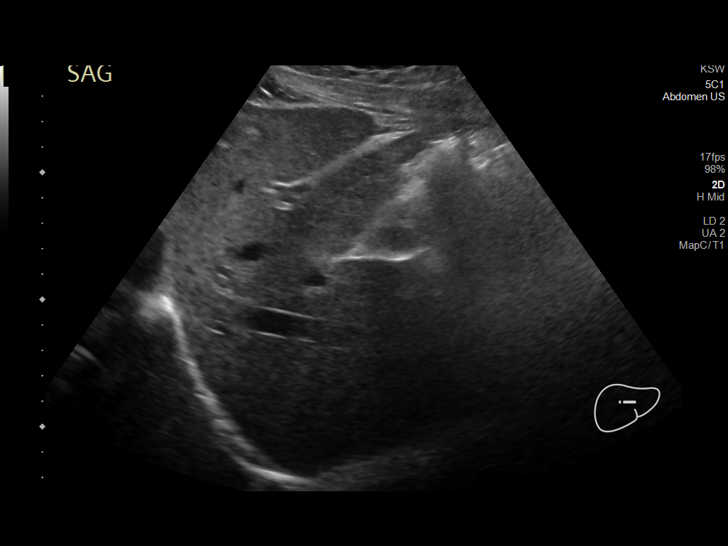
[im 124/149]
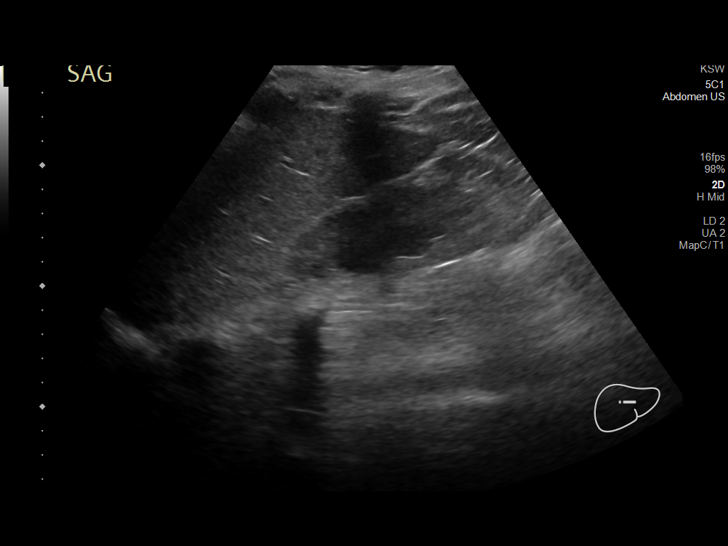
[im 136/149]
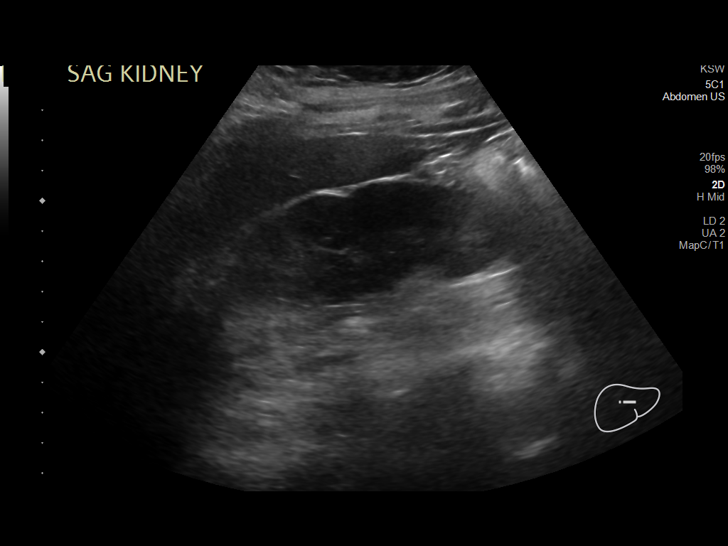
[im 149/149]
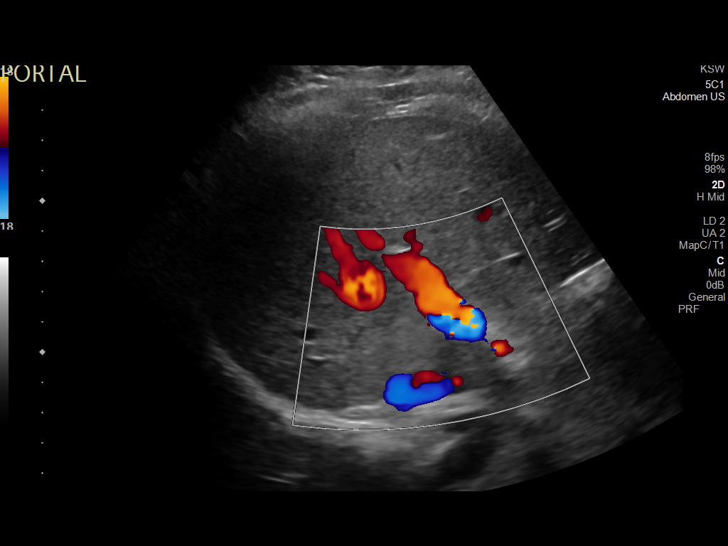

[14 of 25 positions shown; findings below may reference images not displayed]

FINDINGS: Gallbladder:

Multiple shadowing gallstones are seen within the gallbladder lumen,
with diffuse gallbladder wall thickening measuring up to 5 mm.
Positive sonographic Murphy sign. Findings are compatible with acute
cholecystitis.

Common bile duct:

Diameter: 3 mm

Liver:

No focal lesion identified. Within normal limits in parenchymal
echogenicity. Portal vein is patent on color Doppler imaging with
normal direction of blood flow towards the liver.

Other: Right kidney is unremarkable measuring 10.9 cm. Visualized
portions of the pancreas are normal.
IMPRESSION: 1. Cholelithiasis, with sonographic evidence of acute cholecystitis.

## 2022-01-27 IMAGING — CT CT ABD-PELV W/ CM
2 of 4 series · 17 of 46 positions shown, 19 images · IV contrast (omnipaque)
Comparison: None.

CLINICAL DATA: Lower abdominal pain for 2 days

EXAM:
CT ABDOMEN AND PELVIS WITH CONTRAST
TECHNIQUE: Multidetector CT imaging of the abdomen and pelvis was performed
using the standard protocol following bolus administration of
intravenous contrast.
CONTRAST:  100mL OMNIPAQUE IOHEXOL 300 MG/ML  SOLN

[Series 3: abdomen 5.0 · axial · 0.72mm/px · z∈[-518,-118]mm · 14 of 90 slices shown, 16 images]
[im 5/90  soft-tissue]
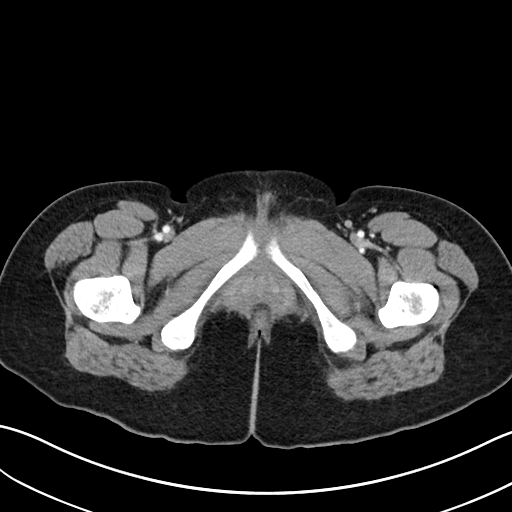
[im 5/90  bone]
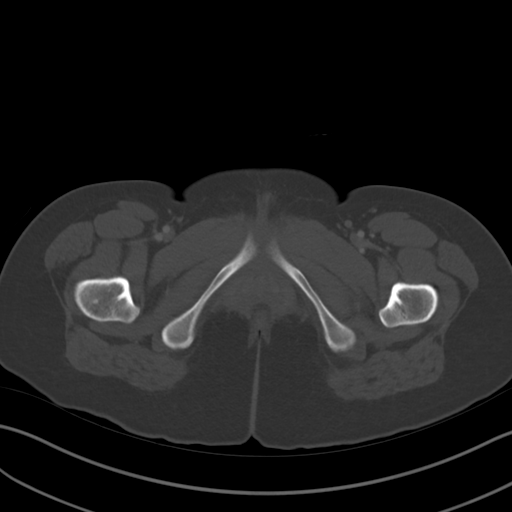
[im 10/90  soft-tissue]
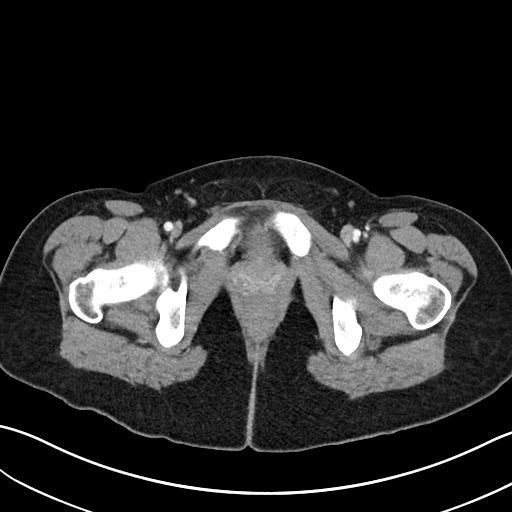
[im 20/90  soft-tissue]
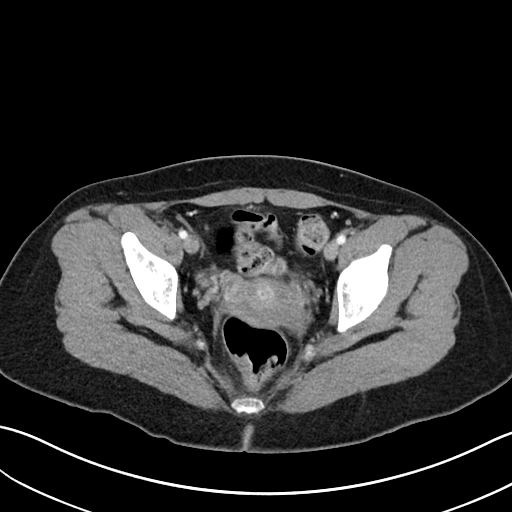
[im 25/90  soft-tissue]
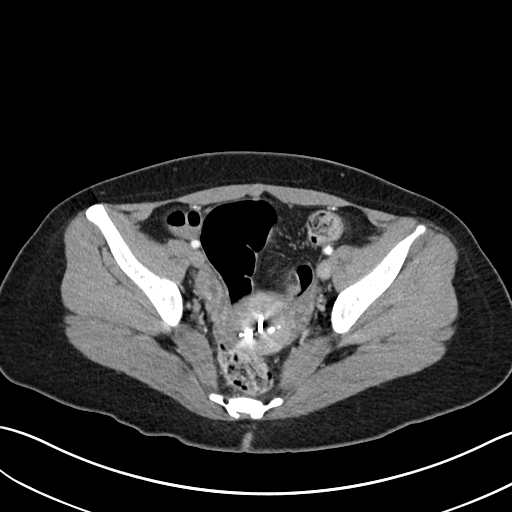
[im 30/90  soft-tissue]
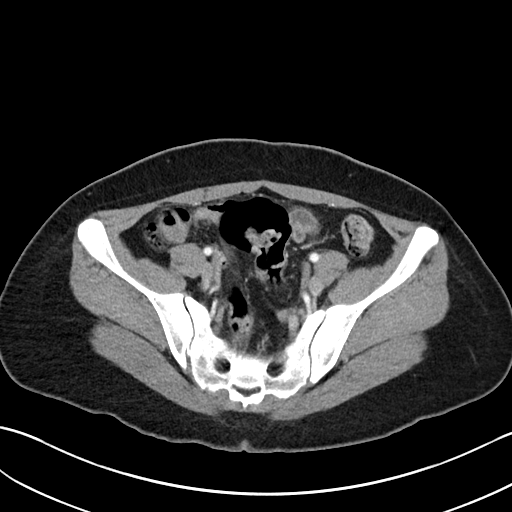
[im 35/90  soft-tissue]
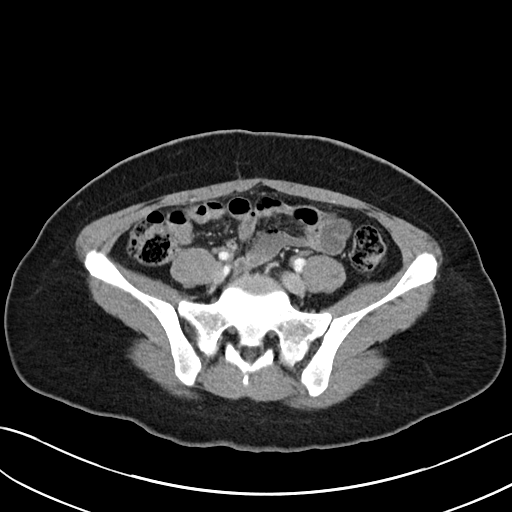
[im 40/90  soft-tissue]
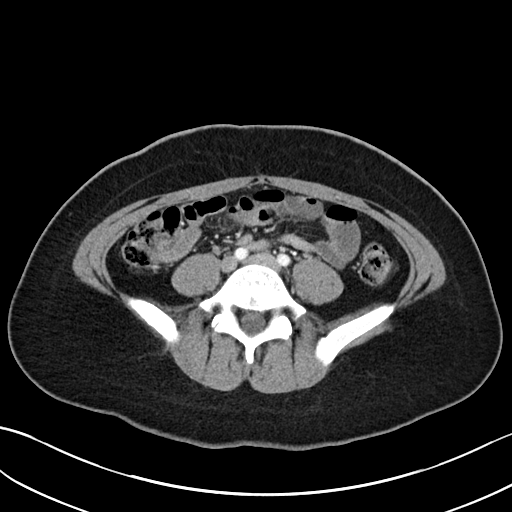
[im 50/90  soft-tissue]
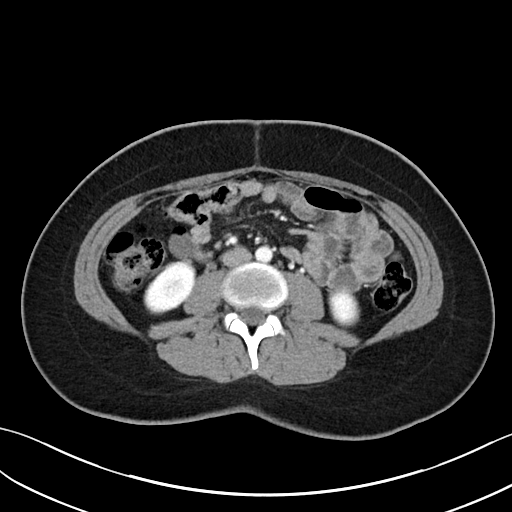
[im 55/90  soft-tissue]
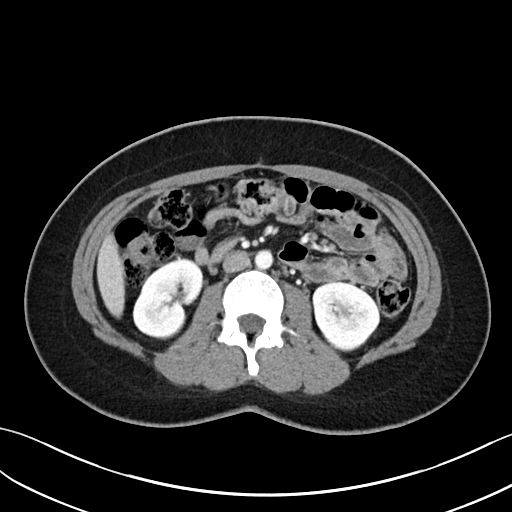
[im 55/90  bone]
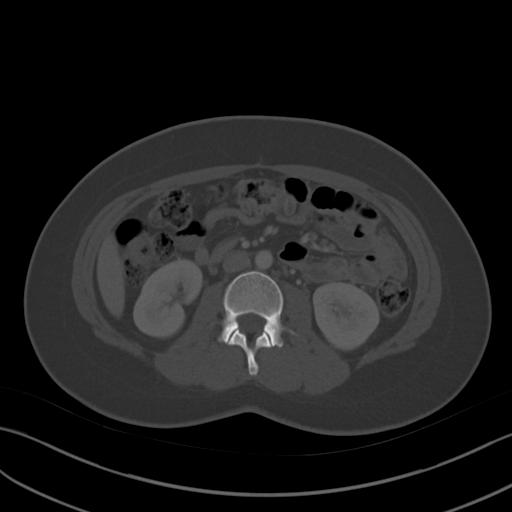
[im 60/90  soft-tissue]
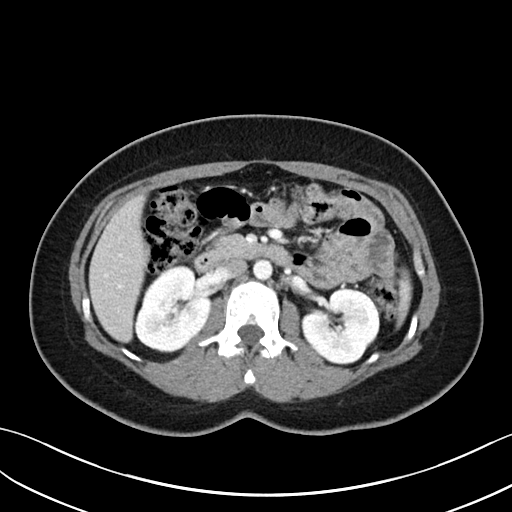
[im 65/90  soft-tissue]
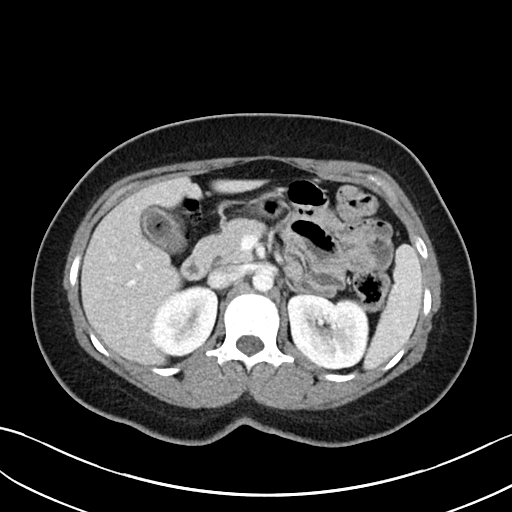
[im 70/90  soft-tissue]
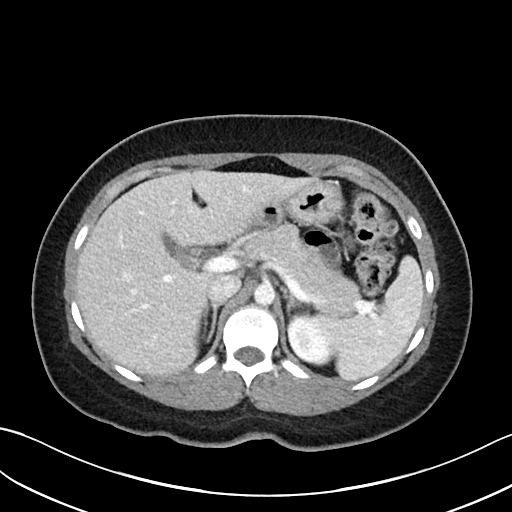
[im 80/90  soft-tissue]
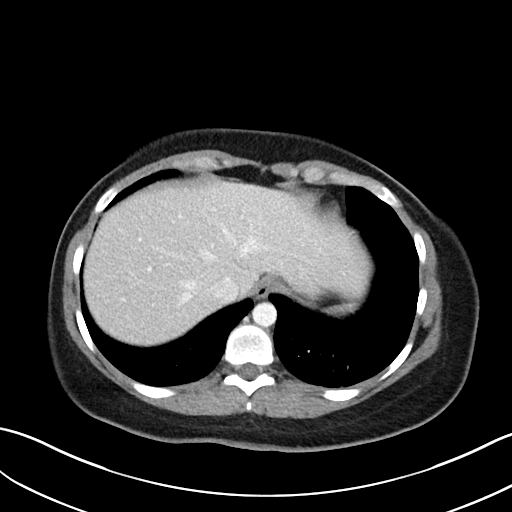
[im 85/90  soft-tissue]
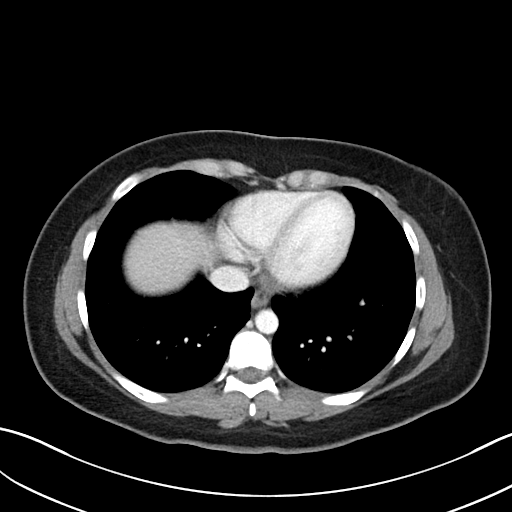

[Series 6: abdomen 3.0 mpr cor · coronal · 0.77mm/px · 3 of 84 slices shown]
[im 28/84  soft-tissue]
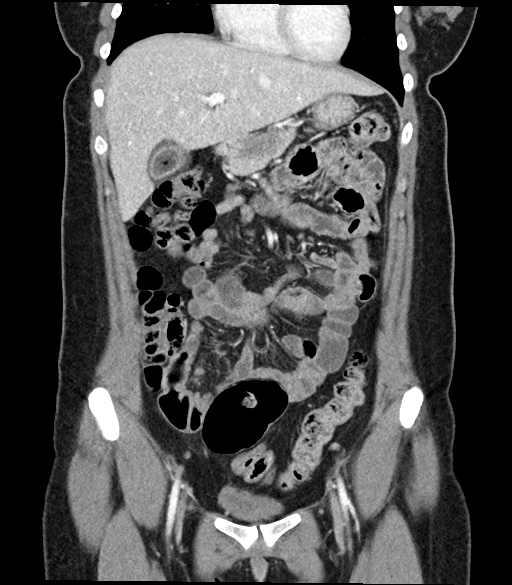
[im 37/84  soft-tissue]
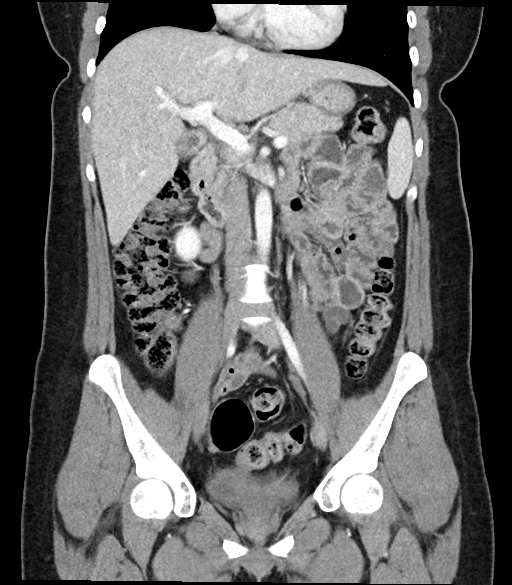
[im 47/84  soft-tissue]
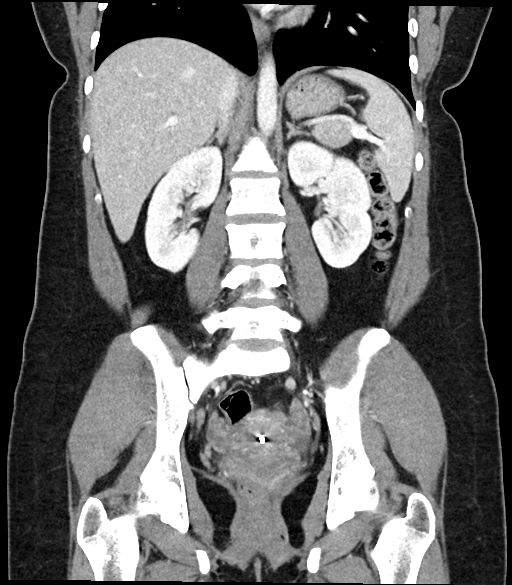

[17 of 46 positions shown; findings below may reference images not displayed]

FINDINGS: Lower chest: Lung bases are clear.

Hepatobiliary: No focal hepatic lesion. Cholesterol gallstone in the
fundus of the gallbladder. Gallbladder is nondistended. Small amount
pericholecystic fluid is present. Common bile duct normal caliber.

Pancreas: Pancreas is normal. No ductal dilatation. No pancreatic
inflammation.

Spleen: Normal spleen

Adrenals/urinary tract: Adrenal glands and kidneys are normal. The
ureters and bladder normal.

Stomach/Bowel: Stomach, small bowel, appendix, and cecum are normal.
The colon and rectosigmoid colon are normal.

Vascular/Lymphatic: Abdominal aorta is normal caliber. No periportal
or retroperitoneal adenopathy. No pelvic adenopathy.

Reproductive: IUD in expected location uterus.  Ovaries normal.

Other: No free fluid.

Musculoskeletal: No aggressive osseous lesion.
IMPRESSION: 1. Cholelithiasis without evidence cholecystitis.
2. Normal appendix.
3. No obstructive uropathy.
4. Normal uterus and ovaries.
1.

## 2022-07-10 DIAGNOSIS — E039 Hypothyroidism, unspecified: Secondary | ICD-10-CM | POA: Insufficient documentation

## 2023-11-08 DIAGNOSIS — R635 Abnormal weight gain: Secondary | ICD-10-CM | POA: Insufficient documentation

## 2023-11-08 DIAGNOSIS — L659 Nonscarring hair loss, unspecified: Secondary | ICD-10-CM | POA: Insufficient documentation

## 2024-06-25 ENCOUNTER — Ambulatory Visit: Admission: EM | Admit: 2024-06-25 | Discharge: 2024-06-25 | Disposition: A | Discharge status: Home / Self Care

## 2024-06-25 DIAGNOSIS — J208 Acute bronchitis due to other specified organisms: Secondary | ICD-10-CM | POA: Diagnosis not present

## 2024-06-25 DIAGNOSIS — Z13228 Encounter for screening for other metabolic disorders: Secondary | ICD-10-CM | POA: Insufficient documentation

## 2024-06-25 MED ORDER — ONDANSETRON 4 MG PO TBDP
4.0000 mg | ORAL_TABLET | Freq: Once | ORAL | Status: AC
  Administered 2024-06-25: 4 mg via ORAL

## 2024-06-25 MED ORDER — PREDNISONE 10 MG (21) PO TBPK: ORAL_TABLET | Freq: Every day | ORAL | 0 refills | Status: AC

## 2024-06-25 MED ORDER — PROMETHAZINE-DM 6.25-15 MG/5ML PO SYRP: 5.0000 mL | ORAL_SOLUTION | Freq: Four times a day (QID) | ORAL | 0 refills | Status: AC | PRN

## 2024-06-25 NOTE — ED Provider Notes (Signed)
 [facility]    CSN: [id number] Arrival date & time: 06/25/24  1545      History   Chief Complaint Chief Complaint  Patient presents with   Cough   Nausea    HPI Lindsay Richardson is a 28 y.o. female presenting on day 7 of viral syndrome.  Medical history noncontributory, she does not have a past medical history of pulmonary disease. Patient reports experiencing a cough for about a week. This morning, she woke up with loose stools, diarrhea, nausea, vomiting, and episodes of sweating. Vomiting 3x today after coughing. Cough is nonproductive. Episode of wheezing last night. Oral intake has decreased. Although the patient does not have asthma, they use an inhaler (Albuterol ) as needed, which was prescribed 2022, with the last use earlier today. They have tried cough drops, Delsym, and other over-the-counter medications without relief. No known fever. The most recent vomiting episode occurred around 10:00 AM, three episodes of emesis today.  Has attempted Dayquil and Nyquil and Delsym during present illness.   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 Authorizing Provider  albuterol  (VENTOLIN  HFA) 108 (90 Base) MCG/ACT inhaler Inhale 1-2 puffs into the lungs every 6 (six) hours as needed for wheezing or shortness of breath. 05/27/21  Yes Christopher Savannah, PA-C  atomoxetine (STRATTERA) 25 MG capsule Take 25 mg by mouth every morning. 04/19/24  Yes [provider]  escitalopram (LEXAPRO) 10 MG tablet Take 10 mg by mouth daily.   Yes [provider]  levothyroxine (SYNTHROID) 25 MCG tablet Take 25 mcg by mouth daily.   Yes [provider]  paragard intrauterine copper IUD IUD 1 each by Intrauterine route. 02/17/22  Yes [provider]  predniSONE (STERAPRED UNI-PAK 21 TAB) 10 MG (21) TBPK tablet Take by mouth daily. Take 6 tabs by mouth daily  for 2 days, then 5 tabs for 2 days, then 4 tabs for 2 days, then 3 tabs for 2 days, 2 tabs for 2 days, then 1 tab by mouth daily for 2 days 06/25/24  Yes Arlyss Leita BRAVO, PA-C  promethazine-dextromethorphan (PROMETHAZINE-DM)  6.25-15 MG/5ML syrup Take 5 mLs by mouth 4 (four) times daily as needed for cough. 06/25/24  Yes Chariti Havel E, PA-C  cetirizine  (ZYRTEC  ALLERGY) 10 MG tablet Take 1 tablet (10 mg total) by mouth daily. Patient not taking: Reported on 09/23/2021 05/27/21   Christopher Savannah, PA-C  famotidine (PEPCID) 20 MG tablet Take 20 mg by mouth daily as needed for heartburn or indigestion.    [provider]  fluticasone (FLONASE) 50 MCG/ACT nasal spray Place 2 sprays into both nostrils daily.    [provider]  Multiple Vitamin (MULTIVITAMIN) tablet Take 1 tablet by mouth daily.    [provider]  Prenatal Vit-Fe Fumarate-FA (PRENATAL MULTIVITAMIN) TABS tablet Take 1  tablet by mouth daily at 12 noon.    [provider]    Family History History reviewed. No pertinent family history.  Social History Social History   Tobacco Use   Smoking status: Never   Smokeless tobacco: Never  Vaping Use   Vaping status: Never Used  Substance Use Topics   Alcohol use: No   Drug use: Never     Allergies   Bupropion   Review of Systems Review of Systems  Constitutional:  Negative for appetite change, chills and fever.  HENT:  Positive for congestion. Negative for ear pain, rhinorrhea, sinus pressure, sinus pain and sore throat.   Eyes:  Negative for redness and visual disturbance.  Respiratory:  Positive for cough. Negative for chest tightness, shortness of breath and wheezing.   Cardiovascular:  Negative for chest pain and palpitations.  Gastrointestinal:  Positive for nausea and vomiting. Negative for abdominal pain, constipation and diarrhea.  Genitourinary:  Negative for dysuria, frequency and urgency.  Musculoskeletal:  Negative for myalgias.  Neurological:  Negative for dizziness, weakness and headaches.  Psychiatric/Behavioral:  Negative for confusion.   All other systems reviewed and are negative.    Physical Exam Triage Vital Signs ED Triage Vitals  Encounter Vitals Group     BP 06/25/24 1632 121/61     Girls Systolic BP Percentile --      Girls Diastolic BP Percentile --      Boys Systolic BP Percentile --      Boys Diastolic BP Percentile --      Pulse Rate 06/25/24 1629 75     Resp 06/25/24 1629 20     Temp 06/25/24 1629 98.6 F (37 C)     Temp Source 06/25/24 1629 Oral     SpO2 06/25/24 1629 97 %     Weight 06/25/24 1624 155 lb (70.3 kg)     Height 06/25/24 1624 4' 11 (1.499 m)     Head Circumference --      Peak Flow --      Pain Score 06/25/24 1620 0     Pain Loc --      Pain Education --      Exclude from Growth Chart --    No data found.  Updated Vital Signs BP 121/61 (BP Location: Left Arm)   Pulse 75    Temp 98.6 F (37 C) (Oral)   Resp 20   Ht 4' 11 (1.499 m)   Wt 155 lb (70.3 kg)   LMP 06/03/2024 (Approximate)   SpO2 97%   BMI 31.31 kg/m   Visual Acuity Right Eye Distance:   Left Eye Distance:   Bilateral Distance:    Right Eye Near:   Left Eye Near:    Bilateral Near:  Physical Exam Vitals reviewed.  Constitutional:      General: She is not in acute distress.    Appearance: Normal appearance. She is not ill-appearing.  HENT:     Head: Normocephalic and atraumatic.     Right Ear: Tympanic membrane, ear canal and external ear normal. No tenderness. No middle ear effusion. There is no impacted cerumen. Tympanic membrane is not perforated, erythematous, retracted or bulging.     Left Ear: Tympanic membrane, ear canal and external ear normal. No tenderness.  No middle ear effusion. There is no impacted cerumen. Tympanic membrane is not perforated, erythematous, retracted or bulging.     Nose: Nose normal. No congestion.     Mouth/Throat:     Mouth: Mucous membranes are moist.     Pharynx: Uvula midline. No oropharyngeal exudate or posterior oropharyngeal erythema.  Eyes:     Extraocular Movements: Extraocular movements intact.     Pupils: Pupils are equal, round, and reactive to light.  Cardiovascular:     Rate and Rhythm: Normal rate and regular rhythm.     Heart sounds: Normal heart sounds.  Pulmonary:     Effort: Pulmonary effort is normal.     Breath sounds: Normal breath sounds. No decreased breath sounds, wheezing, rhonchi or rales.     Comments: Frequent cough Abdominal:     Palpations: Abdomen is soft.     Tenderness: There is no abdominal tenderness. There is no guarding or rebound.  Lymphadenopathy:     Cervical: No cervical adenopathy.     Right cervical: No superficial cervical adenopathy.    Left cervical: No superficial cervical adenopathy.  Neurological:     General: No focal deficit present.     Mental Status: She is alert and oriented to  person, place, and time.  Psychiatric:        Mood and Affect: Mood normal.        Behavior: Behavior normal.        Thought Content: Thought content normal.        Judgment: Judgment normal.      UC Treatments / Results  Labs (all labs ordered are listed, but only abnormal results are displayed) Labs Reviewed - No data to display  EKG   Radiology No results found.  Procedures Procedures (including critical care time)  Medications Ordered in UC Medications  ondansetron  (ZOFRAN -ODT) disintegrating tablet 4 mg (4 mg Oral Given 06/25/24 1633)    Initial Impression / Assessment and Plan / UC Course  I have reviewed the triage vital signs and the nursing notes.  Pertinent labs & imaging results that were available during my care of the patient were reviewed by me and considered in my medical decision making (see chart for details).     Patient is a 28 year old female presenting with viral bronchitis.  She is afebrile and nontachycardic.  Last antipyretic greater than 8 hours ago.  She does not have a history of pulmonary disease.  On exam, there are no adventitious breath sounds, and she is oxygenating well on room air. Paraguard IUD for contraception.  Did not check a covid or influenza test due to duration of symptoms.  Will manage with prednisone taper and Promethazine DM cough syrup.  She does not have diabetes. She was prescribed albuterol  inhaler in 2022 for a similar illness, she does not have asthma, she has enough of this inhaler left for the remainder of present illness. Return precautions as below.  Final Clinical Impressions(s) / UC Diagnoses  Final diagnoses:  Viral bronchitis     Discharge Instructions      -Prednisone taper. Take in the morning as this can give you energy -Promethazine DM cough syrup for congestion/cough. This could make you drowsy, so take at night before bed. -Continue over-the-counter medications if they are helping - Follow-up if  symptoms change or worsen, like new fevers, you cough up purulent or red sputum, etc.     ED Prescriptions     Medication Sig Dispense Auth. Provider   predniSONE (STERAPRED UNI-PAK 21 TAB) 10 MG (21) TBPK tablet Take by mouth daily. Take 6 tabs by mouth daily  for 2 days, then 5 tabs for 2 days, then 4 tabs for 2 days, then 3 tabs for 2 days, 2 tabs for 2 days, then 1 tab by mouth daily for 2 days 42 tablet Zain Lankford E, PA-C   promethazine-dextromethorphan (PROMETHAZINE-DM) 6.25-15 MG/5ML syrup Take 5 mLs by mouth 4 (four) times daily as needed for cough. 118 mL Ajax Schroll E, PA-C      PDMP not reviewed this encounter.   Arlyss Leita BRAVO, PA-C 06/25/24 1752

## 2024-06-25 NOTE — ED Triage Notes (Signed)
 Patient reports experiencing a cough for more than a week. This morning, they woke up with loose stools, diarrhea, nausea, vomiting, and episodes of sweating. Oral intake has decreased. Although the patient does not have asthma, they use an inhaler (Albuterol ) as needed, with the last use earlier today. They have tried cough drops, Delsym, and other over-the-counter medications without relief. No known fever. The most recent vomiting episode occurred around 10:00 AM.

## 2024-06-25 NOTE — Discharge Instructions (Addendum)
-  Prednisone taper. Take in the morning as this can give you energy -Promethazine DM cough syrup for congestion/cough. This could make you drowsy, so take at night before bed. -Continue over-the-counter medications if they are helping - Follow-up if symptoms change or worsen, like new fevers, you cough up purulent or red sputum, etc.

## 2024-07-15 ENCOUNTER — Emergency Department (HOSPITAL_BASED_OUTPATIENT_CLINIC_OR_DEPARTMENT_OTHER)
Admission: EM | Admit: 2024-07-15 | Discharge: 2024-07-15 | Disposition: A | Discharge status: Home / Self Care | Attending: Emergency Medicine | Admitting: Emergency Medicine

## 2024-07-15 ENCOUNTER — Encounter (HOSPITAL_BASED_OUTPATIENT_CLINIC_OR_DEPARTMENT_OTHER): Payer: Self-pay | Admitting: Emergency Medicine

## 2024-07-15 ENCOUNTER — Other Ambulatory Visit: Payer: Self-pay

## 2024-07-15 DIAGNOSIS — H938X3 Other specified disorders of ear, bilateral: Secondary | ICD-10-CM | POA: Diagnosis not present

## 2024-07-15 DIAGNOSIS — H9203 Otalgia, bilateral: Secondary | ICD-10-CM | POA: Diagnosis present

## 2024-07-15 HISTORY — DX: Disorder of thyroid, unspecified: E07.9

## 2024-07-15 LAB — PREGNANCY, URINE: Preg Test, Ur: NEGATIVE

## 2024-07-15 MED ORDER — KETOROLAC TROMETHAMINE 10 MG PO TABS: 10.0000 mg | ORAL_TABLET | Freq: Four times a day (QID) | ORAL | 0 refills | Status: AC | PRN

## 2024-07-15 MED ORDER — AMOXICILLIN 875 MG PO TABS: 875.0000 mg | ORAL_TABLET | Freq: Two times a day (BID) | ORAL | 0 refills | Status: AC

## 2024-07-15 MED ORDER — KETOROLAC TROMETHAMINE 60 MG/2ML IM SOLN
30.0000 mg | Freq: Once | INTRAMUSCULAR | Status: AC
  Administered 2024-07-15: 30 mg via INTRAMUSCULAR
  Filled 2024-07-15: qty 2

## 2024-07-15 MED ORDER — METHYLPREDNISOLONE 4 MG PO TBPK
ORAL_TABLET | ORAL | 0 refills | Status: AC
Start: 2024-07-15 — End: ?

## 2024-07-15 NOTE — ED Notes (Signed)
 DC paperwork given and verbally understood.

## 2024-07-15 NOTE — ED Provider Notes (Signed)
 [facility] AT [facility] Provider Note   CSN: [id number] Arrival date & time: 07/15/24  9255     Patient presents with: Otalgia   Lindsay Richardson is a 28 y.o. female.    Otalgia    28 year old female presenting to the emergency department with a chief complaint of otalgia on the left.  The patient states that her left ear started hurting around 1:00 this morning.  She took Tylenol  at 3 AM.  She also endorses some pain in her right ear as well.  [place], denies any sudden pain in her ears.  No fevers, recently diagnosed with bronchitis a few weeks ago.  Not currently on antibiotics not recently treated with any antibiotics.  Prior to Admission medications   Medication Sig Start Date End Date Taking? Authorizing Provider  amoxicillin  (AMOXIL ) 875 MG tablet Take 1 tablet (875 mg total) by mouth 2 (two) times daily for 5 days. 07/15/24 07/20/24 Yes Jerrol Agent, MD  ketorolac  (TORADOL ) 10 MG tablet Take 1 tablet (10 mg total) by mouth every 6 (six) hours as needed. 07/15/24  Yes Jerrol Agent, MD  methylPREDNISolone (MEDROL DOSEPAK) 4 MG TBPK tablet Take as directed on the box 07/15/24  Yes Jerrol Agent, MD  albuterol  (VENTOLIN  HFA) 108 (90 Base) MCG/ACT inhaler Inhale 1-2 puffs into the lungs every 6 (six) hours as needed for wheezing or shortness of breath. 05/27/21   Christopher Savannah, PA-C  atomoxetine (STRATTERA) 25 MG capsule Take 25 mg by mouth every morning. 04/19/24   [provider]  cetirizine  (ZYRTEC  ALLERGY) 10 MG tablet Take 1 tablet (10 mg total) by mouth daily. Patient not taking: Reported on 09/23/2021 05/27/21   Christopher Savannah, PA-C  escitalopram (LEXAPRO) 10 MG tablet Take 10 mg by mouth daily.    [provider]  famotidine (PEPCID) 20 MG tablet Take 20 mg by mouth daily as needed for heartburn or indigestion.    [provider]  fluticasone (FLONASE) 50 MCG/ACT nasal spray Place 2  sprays into both nostrils daily.    [provider]  levothyroxine (SYNTHROID) 25 MCG tablet Take 25 mcg by mouth daily.    [provider]  Multiple Vitamin (MULTIVITAMIN) tablet Take 1 tablet by mouth daily.    [provider]  paragard intrauterine copper IUD IUD 1 each by Intrauterine route. 02/17/22   [provider]  predniSONE (STERAPRED UNI-PAK 21 TAB) 10 MG (21) TBPK tablet Take by mouth daily. Take 6 tabs by mouth daily  for 2 days, then 5 tabs for 2 days, then 4 tabs for 2 days, then 3 tabs for 2 days, 2 tabs for 2 days, then 1 tab by mouth daily for 2 days 06/25/24   Graham, Laura E, PA-C  Prenatal Vit-Fe Fumarate-FA (PRENATAL MULTIVITAMIN) TABS tablet Take 1 tablet by mouth daily at 12 noon.    [provider]  promethazine-dextromethorphan (PROMETHAZINE-DM) 6.25-15 MG/5ML syrup Take 5 mLs by mouth 4 (four) times daily as needed for cough. 06/25/24   Graham, Laura E, PA-C    Allergies: Bupropion    Review of Systems  HENT:  Positive for ear pain.   All other systems reviewed and are negative.   Updated Vital Signs BP 107/77 (BP Location: Right Arm)   Pulse 76   Temp 97.8 F (36.6 C) (Oral)   Resp 18   Wt 70.3 kg   LMP 06/03/2024 (Approximate)   SpO2 99%   BMI 31.31 kg/m  Physical Exam Vitals and nursing note reviewed.  Constitutional:      General: She is not in acute distress. HENT:     Head: Normocephalic and atraumatic.     Right Ear: Ear canal and external ear normal.     Left Ear: Ear canal and external ear normal.     Ears:     Comments: Very mild scattered cerumen in the ear canal, does not obscure any visualization of the tympanic membrane bilaterally.  Left TM mildly more erythematous compared to the right, no obvious bulging, no vesicles.  No TM rupture bilaterally Eyes:     Conjunctiva/sclera: Conjunctivae normal.     Pupils: Pupils are equal, round, and reactive to light.  Cardiovascular:     Rate and  Rhythm: Normal rate and regular rhythm.  Pulmonary:     Effort: Pulmonary effort is normal. No respiratory distress.  Abdominal:     General: There is no distension.     Tenderness: There is no guarding.  Musculoskeletal:        General: No deformity or signs of injury.     Cervical back: Neck supple.  Skin:    Findings: No lesion or rash.  Neurological:     General: No focal deficit present.     Mental Status: She is alert. Mental status is at baseline.     (all labs ordered are listed, but only abnormal results are displayed) Labs Reviewed  PREGNANCY, URINE    EKG: None  Radiology: No results found.   Procedures   Medications Ordered in the ED  ketorolac  (TORADOL ) injection 30 mg (has no administration in time range)                                    Medical Decision Making Amount and/or Complexity of Data Reviewed Labs: ordered.  Risk Prescription drug management.    28 year old female presenting to the emergency department with a chief complaint of otalgia on the left.  The patient states that her left ear started hurting around 1:00 this morning.  She took Tylenol  at 3 AM.  She also endorses some pain in her right ear as well.  [place], denies any sudden pain in her ears.  No fevers, recently diagnosed with bronchitis a few weeks ago.  Not currently on antibiotics not recently treated with any antibiotics.  On arrival, the patient was vitally stable.  On exam, patient presenting with otalgia.  Could be developing otitis media for which we will treat with a course of prednisone, antibiotics and Toradol .  Toradol  IM provided for pain control.  Patient overall well-appearing and vitally stable, plan for discharge and outpatient PCP follow-up.     Final diagnoses:  Otalgia of both ears    ED Discharge Orders          Ordered    methylPREDNISolone (MEDROL DOSEPAK) 4 MG TBPK tablet        07/15/24 0912    ketorolac  (TORADOL ) 10  MG tablet  Every 6 hours PRN        07/15/24 0912    amoxicillin  (AMOXIL ) 875 MG tablet  2 times daily        07/15/24 0912               Jerrol Agent, MD 07/15/24 [phone]

## 2024-07-15 NOTE — Discharge Instructions (Signed)
 Your symptoms of could be due to developing otitis media for which we will discharge you on Toradol , amoxicillin  and a Medrol Dosepak.  Recommend you follow-up with a primary care provider for repeat evaluation.

## 2024-07-15 NOTE — ED Triage Notes (Signed)
 Pt endorses LT ear pain that started at ~ 0100. Tylenol  at 0300. Also reports RT ear pain that started pta, ~ 0600. Denies ear drainage. Recent dx with bronchitis

## 2024-10-10 ENCOUNTER — Encounter (HOSPITAL_BASED_OUTPATIENT_CLINIC_OR_DEPARTMENT_OTHER): Payer: Self-pay | Admitting: Family Medicine

## 2024-10-11 ENCOUNTER — Encounter (HOSPITAL_BASED_OUTPATIENT_CLINIC_OR_DEPARTMENT_OTHER): Payer: Self-pay | Admitting: Family Medicine
